# Patient Record
Sex: Male | Born: 1970
Health system: Southern US, Community
[De-identification: ages and names within clinical notes are randomized; demographics above are authoritative.]

## PROBLEM LIST (undated history)

## (undated) DIAGNOSIS — E785 Hyperlipidemia, unspecified: Secondary | ICD-10-CM

## (undated) DIAGNOSIS — I10 Essential (primary) hypertension: Secondary | ICD-10-CM

## (undated) DIAGNOSIS — E119 Type 2 diabetes mellitus without complications: Secondary | ICD-10-CM

## (undated) HISTORY — DX: Essential (primary) hypertension: I10

## (undated) HISTORY — DX: Hyperlipidemia, unspecified: E78.5

## (undated) HISTORY — PX: APPENDECTOMY: SHX54

## (undated) HISTORY — DX: Type 2 diabetes mellitus without complications: E11.9

---

## 2012-09-01 ENCOUNTER — Telehealth: Payer: Self-pay | Admitting: Nurse Practitioner

## 2012-09-01 NOTE — Telephone Encounter (Signed)
Will send chart back.

## 2012-09-01 NOTE — Telephone Encounter (Signed)
Pt notified samples up front

## 2012-09-01 NOTE — Telephone Encounter (Signed)
Ok to give samples

## 2012-09-29 ENCOUNTER — Telehealth: Payer: Self-pay | Admitting: Nurse Practitioner

## 2012-09-29 NOTE — Telephone Encounter (Signed)
OK form janumet samples

## 2012-09-29 NOTE — Telephone Encounter (Signed)
Please advise 

## 2012-09-29 NOTE — Telephone Encounter (Signed)
Pt aware and samples left up front 

## 2012-11-06 ENCOUNTER — Telehealth: Payer: Self-pay | Admitting: Nurse Practitioner

## 2012-11-06 NOTE — Telephone Encounter (Signed)
Samples up front patient aware  

## 2012-12-29 ENCOUNTER — Telehealth: Payer: Self-pay | Admitting: Nurse Practitioner

## 2012-12-29 NOTE — Telephone Encounter (Signed)
Samples up front for patient to pick up and a detailed message was left

## 2013-02-01 ENCOUNTER — Telehealth: Payer: Self-pay | Admitting: Nurse Practitioner

## 2013-02-01 NOTE — Telephone Encounter (Signed)
Ok for [employer] samples 50/500- no ketoconazole-NTBS

## 2013-02-02 NOTE — Telephone Encounter (Signed)
Patient aware.

## 2013-03-08 ENCOUNTER — Telehealth: Payer: Self-pay | Admitting: Nurse Practitioner

## 2013-03-08 NOTE — Telephone Encounter (Signed)
Up front 

## 2013-04-05 ENCOUNTER — Telehealth: Payer: Self-pay | Admitting: Nurse Practitioner

## 2013-04-16 NOTE — Telephone Encounter (Signed)
Aware. 

## 2013-05-09 ENCOUNTER — Telehealth: Payer: Self-pay | Admitting: Nurse Practitioner

## 2013-05-11 NOTE — Telephone Encounter (Signed)
Patient aware samples up front  

## 2013-06-21 ENCOUNTER — Telehealth: Payer: Self-pay | Admitting: Nurse Practitioner

## 2013-06-21 NOTE — Telephone Encounter (Signed)
Patient aware samples up front  

## 2013-07-12 ENCOUNTER — Telehealth: Payer: Self-pay | Admitting: Nurse Practitioner

## 2013-07-13 NOTE — Telephone Encounter (Signed)
Detailed message left that samples are up front to pick up

## 2013-08-13 ENCOUNTER — Telehealth: Payer: Self-pay | Admitting: Nurse Practitioner

## 2013-08-13 NOTE — Telephone Encounter (Signed)
Pt aware no samples available and to try back later

## 2013-08-20 ENCOUNTER — Telehealth: Payer: Self-pay | Admitting: Nurse Practitioner

## 2013-08-20 NOTE — Telephone Encounter (Signed)
Samples up front 

## 2013-09-25 ENCOUNTER — Telehealth: Payer: Self-pay | Admitting: Nurse Practitioner

## 2013-10-02 NOTE — Telephone Encounter (Signed)
No samples available 

## 2013-10-06 ENCOUNTER — Telehealth: Payer: Self-pay | Admitting: Nurse Practitioner

## 2013-10-08 ENCOUNTER — Telehealth: Payer: Self-pay | Admitting: Nurse Practitioner

## 2013-10-08 NOTE — Telephone Encounter (Signed)
Samples up front for patient to pick up.  

## 2013-10-08 NOTE — Telephone Encounter (Signed)
Patient aware mmm has no appts available today

## 2013-10-16 ENCOUNTER — Telehealth: Payer: Self-pay | Admitting: Nurse Practitioner

## 2013-10-16 NOTE — Telephone Encounter (Signed)
He just had a question that i answered.

## 2013-10-19 ENCOUNTER — Telehealth: Payer: Self-pay | Admitting: Nurse Practitioner

## 2013-10-19 NOTE — Telephone Encounter (Signed)
appt made

## 2013-10-22 ENCOUNTER — Encounter: Payer: Self-pay | Admitting: Nurse Practitioner

## 2013-10-22 ENCOUNTER — Encounter (INDEPENDENT_AMBULATORY_CARE_PROVIDER_SITE_OTHER): Payer: Self-pay

## 2013-10-22 ENCOUNTER — Ambulatory Visit: Payer: Self-pay | Admitting: Nurse Practitioner

## 2013-10-22 VITALS — BP 135/83 | HR 80 | Temp 98.1°F | Ht 71.0 in | Wt 236.0 lb

## 2013-10-22 DIAGNOSIS — L259 Unspecified contact dermatitis, unspecified cause: Secondary | ICD-10-CM

## 2013-10-22 DIAGNOSIS — E119 Type 2 diabetes mellitus without complications: Secondary | ICD-10-CM | POA: Insufficient documentation

## 2013-10-22 LAB — GLUCOSE, POCT (MANUAL RESULT ENTRY): POC Glucose: 311 mg/dl — AB (ref 70–99)

## 2013-10-22 LAB — POCT GLYCOSYLATED HEMOGLOBIN (HGB A1C): Hemoglobin A1C: 10.2

## 2013-10-22 MED ORDER — METHYLPREDNISOLONE ACETATE 80 MG/ML IJ SUSP
80.0000 mg | Freq: Once | INTRAMUSCULAR | Status: AC
  Administered 2013-10-22: 80 mg via INTRAMUSCULAR

## 2013-10-22 MED ORDER — METFORMIN HCL 1000 MG PO TABS
1000.0000 mg | ORAL_TABLET | Freq: Two times a day (BID) | ORAL | Status: DC
Start: 2013-10-22 — End: 2014-03-28

## 2013-10-22 MED ORDER — KETOCONAZOLE 200 MG PO TABS: 200.0000 mg | ORAL_TABLET | Freq: Every day | ORAL | Status: DC

## 2013-10-22 MED ORDER — GLIMEPIRIDE 4 MG PO TABS: 4.0000 mg | ORAL_TABLET | Freq: Every day | ORAL | Status: DC

## 2013-10-22 NOTE — Addendum Note (Signed)
Addended by: Bennie PieriniMARTIN, [name] on: 10/22/2013 08:56 AM   Modules accepted: Level of Service

## 2013-10-22 NOTE — Addendum Note (Signed)
Addended by: Bennie PieriniMARTIN, [name] on: 10/22/2013 09:02 AM   Modules accepted: Orders

## 2013-10-22 NOTE — Progress Notes (Signed)
Subjective:    Patient ID: Steven Mcgee, male    DOB: 15-May-1971, 43 y.o.   MRN  Patient here today for follow up of chronic medical problems. He has not been seen in over a year.   Diabetes He presents for his follow-up diabetic visit. He has type 2 diabetes mellitus. No MedicAlert identification noted. The initial diagnosis of diabetes was made 4 years ago. His disease course has been stable. There are no diabetic associated symptoms. Symptoms are stable. Current diabetic treatment includes oral agent (dual therapy) (patient has only been taking janumet once a day instead of 2 times a day.). He is compliant with treatment some of the time. His weight is stable. When asked about meal planning, he reported none. He has not had a previous visit with a dietician. He rarely participates in exercise. (Patient does not check blood sugars at home at all) An ACE inhibitor/angiotensin II receptor blocker is not being taken. He does not see a podiatrist.Eye exam is not current.      Review of Systems  Constitutional: Negative.   HENT: Negative.   Respiratory: Negative.   Cardiovascular: Negative.   Genitourinary: Negative.   Hematological: Negative.   Psychiatric/Behavioral: Negative.   All other systems reviewed and are negative.      Objective:   Physical Exam  Constitutional: He is oriented to person, place, and time. He appears well-developed and well-nourished.  Eyes: Pupils are equal, round, and reactive to light.  Neck: Normal range of motion. Neck supple.  Pulmonary/Chest: Effort normal and breath sounds normal.  Abdominal: Soft. Bowel sounds are normal.  Neurological: He is alert and oriented to person, place, and time.  Skin: Skin is warm and dry. There is erythema.  erythenmatous maculopapular lesions in linear pattern on forearms and abdomen.  Psychiatric: He has a normal mood and affect. His behavior is normal. Judgment and thought content normal.   BP 135/83  Pulse  80  Temp(Src) 98.1 F (36.7 C) (Oral)  Ht _0  (1.803 m)  Wt 236 lb (107.049 kg)  BMI 32.93 kg/m2  Results for orders placed in visit on 10/22/13  POCT GLYCOSYLATED HEMOGLOBIN (HGB A1C)      Result Value Ref Range   Hemoglobin A1C 10.2    GLUCOSE, POCT (MANUAL RESULT ENTRY)      Result Value Ref Range   POC Glucose 311 (*) 70 - 99 mg/dl         Assessment & Plan:   1. Diabetes    Orders Placed This Encounter  Procedures  . CMP14+EGFR  . POCT glycosylated hemoglobin (Hb A1C)  . POCT glucose (manual entry)   Meds ordered this encounter  Medications  . metFORMIN (GLUCOPHAGE) 1000 MG tablet    Sig: Take 1 tablet (1,000 mg total) by mouth 2 (two) times daily with a meal.    Dispense:  60 tablet    Refill:  3    Order Specific Question:  Supervising Provider    Answer:  Chipper Herb [[id number]]  . glimepiride (AMARYL) 4 MG tablet    Sig: Take 1 tablet (4 mg total) by mouth daily before breakfast.    Dispense:  30 tablet    Refill:  3    Order Specific Question:  Supervising Provider    Answer:  Chipper Herb [[id number]]  . ketoconazole (NIZORAL) 200 MG tablet    Sig: Take 1 tablet (200 mg total) by mouth daily.    Dispense:  30 tablet  Refill:  3    Order Specific Question:  Supervising Provider    Answer:  Chipper Herb [[id number]]   Need to keep diary of blood sugars Labs pending Health maintenance reviewed Diet and exercise encouraged Continue all meds Follow up  In 1 month   [place], FNP

## 2013-10-22 NOTE — Patient Instructions (Signed)

## 2013-10-24 LAB — CMP14+EGFR
A/G RATIO: 1.9 (ref 1.1–2.5)
ALK PHOS: 75 IU/L (ref 39–117)
ALT: 80 IU/L — AB (ref 0–44)
AST: 50 IU/L — AB (ref 0–40)
Albumin: 4.8 g/dL (ref 3.5–5.5)
BILIRUBIN TOTAL: 0.6 mg/dL (ref 0.0–1.2)
BUN / CREAT RATIO: 14 (ref 9–20)
BUN: 14 mg/dL (ref 6–24)
CO2: 22 mmol/L (ref 18–29)
CREATININE: 0.98 mg/dL (ref 0.76–1.27)
Calcium: 10.1 mg/dL (ref 8.7–10.2)
Chloride: 100 mmol/L (ref 97–108)
GFR, EST AFRICAN AMERICAN: 107 mL/min/{1.73_m2} (ref >59)
GFR, EST NON AFRICAN AMERICAN: 93 mL/min/{1.73_m2} (ref >59)
GLOBULIN, TOTAL: 2.5 g/dL (ref 1.5–4.5)
Glucose: 300 mg/dL — ABNORMAL HIGH (ref 65–99)
POTASSIUM: 4.7 mmol/L (ref 3.5–5.2)
SODIUM: 141 mmol/L (ref 134–144)
Total Protein: 7.3 g/dL (ref 6.0–8.5)

## 2013-11-14 ENCOUNTER — Telehealth: Payer: Self-pay | Admitting: Nurse Practitioner

## 2013-11-14 NOTE — Telephone Encounter (Signed)
appt scheduled

## 2013-12-03 ENCOUNTER — Encounter: Payer: Self-pay | Admitting: Nurse Practitioner

## 2013-12-03 ENCOUNTER — Ambulatory Visit (INDEPENDENT_AMBULATORY_CARE_PROVIDER_SITE_OTHER): Payer: Self-pay | Admitting: Nurse Practitioner

## 2013-12-03 VITALS — BP 146/100 | HR 82 | Temp 98.2°F | Ht 71.0 in | Wt 236.0 lb

## 2013-12-03 DIAGNOSIS — E1165 Type 2 diabetes mellitus with hyperglycemia: Principal | ICD-10-CM

## 2013-12-03 DIAGNOSIS — IMO0001 Reserved for inherently not codable concepts without codable children: Secondary | ICD-10-CM

## 2013-12-03 LAB — POCT UA - MICROALBUMIN: MICROALBUMIN (UR) POC: 50 mg/L

## 2013-12-03 LAB — POCT GLYCOSYLATED HEMOGLOBIN (HGB A1C): Hemoglobin A1C: 9.7

## 2013-12-03 NOTE — Patient Instructions (Signed)

## 2013-12-03 NOTE — Addendum Note (Signed)
Addended by: Bennie PieriniMARTIN, [name] on: 12/03/2013 08:28 AM   Modules accepted: Orders, Level of Service

## 2013-12-03 NOTE — Progress Notes (Signed)
   Subjective:    Patient ID: Steven Mcgee, male    DOB: 07/14/1970, 43 y.o.   MRN  Patient here today for follow up of chronic medical problems. He is usually not compliant about coming in for follow ups but here back in May and his hgba1c was 10.2. We added amaryl along with metformin.   Diabetes He presents for his follow-up diabetic visit. He has type 2 diabetes mellitus. No MedicAlert identification noted. The initial diagnosis of diabetes was made 4 years ago. His disease course has been stable. There are no diabetic associated symptoms. Symptoms are stable. Current diabetic treatment includes oral agent (dual therapy) (patient has only been taking janumet once a day instead of 2 times a day.). He is compliant with treatment some of the time. His weight is stable. When asked about meal planning, he reported none. He has not had a previous visit with a dietician. He rarely participates in exercise. His breakfast blood glucose is taken between 9-10 am. His breakfast blood glucose range is generally 130-140 mg/dl. (Patient does not check blood sugars at home very often and since he did not check them at all before last visit he does not know if they are better or not.) An ACE inhibitor/angiotensin II receptor blocker is not being taken. He does not see a podiatrist.Eye exam is not current.      Review of Systems  Constitutional: Negative.   HENT: Negative.   Respiratory: Negative.   Cardiovascular: Negative.   Genitourinary: Negative.   Hematological: Negative.   Psychiatric/Behavioral: Negative.   All other systems reviewed and are negative.      Objective:   Physical Exam  Constitutional: He is oriented to person, place, and time. He appears well-developed and well-nourished.  Eyes: Pupils are equal, round, and reactive to light.  Neck: Normal range of motion. Neck supple.  Pulmonary/Chest: Effort normal and breath sounds normal.  Abdominal: Soft. Bowel sounds are normal.    Neurological: He is alert and oriented to person, place, and time.  Skin: Skin is warm and dry. There is erythema.  erythenmatous maculopapular lesions in linear pattern on forearms and abdomen.  Psychiatric: He has a normal mood and affect. His behavior is normal. Judgment and thought content normal.   BP 146/100  Pulse 82  Temp(Src) 98.2 F (36.8 C) (Oral)  Ht _0  (1.803 m)  Wt 236 lb (107.049 kg)  BMI 32.93 kg/m2  Results for orders placed in visit on 12/03/13  POCT UA - MICROALBUMIN      Result Value Ref Range   Microalbumin Ur, POC 50             Assessment & Plan:   1. Type II or unspecified type diabetes mellitus without mention of complication, uncontrolled    Orders Placed This Encounter  Procedures  . CMP14+EGFR  . NMR, lipoprofile  . Microalbumin, urine  . POCT glycosylated hemoglobin (Hb A1C)  . POCT UA - Microalbumin   Continue to count carbs- hgba1c is trending downward Labs pending Health maintenance reviewed Diet and exercise encouraged Continue all meds Follow up  In 3 months   [place], FNP

## 2013-12-04 LAB — CMP14+EGFR
ALT: 102 IU/L — AB (ref 0–44)
AST: 64 IU/L — ABNORMAL HIGH (ref 0–40)
Albumin/Globulin Ratio: 2.1 (ref 1.1–2.5)
Albumin: 5 g/dL (ref 3.5–5.5)
Alkaline Phosphatase: 70 IU/L (ref 39–117)
BILIRUBIN TOTAL: 0.7 mg/dL (ref 0.0–1.2)
BUN/Creatinine Ratio: 15 (ref 9–20)
BUN: 14 mg/dL (ref 6–24)
CALCIUM: 9.8 mg/dL (ref 8.7–10.2)
CHLORIDE: 98 mmol/L (ref 97–108)
CO2: 21 mmol/L (ref 18–29)
Creatinine, Ser: 0.92 mg/dL (ref 0.76–1.27)
GFR calc Af Amer: 116 mL/min/{1.73_m2} (ref >59)
GFR calc non Af Amer: 100 mL/min/{1.73_m2} (ref >59)
GLUCOSE: 238 mg/dL — AB (ref 65–99)
Globulin, Total: 2.4 g/dL (ref 1.5–4.5)
Potassium: 4.3 mmol/L (ref 3.5–5.2)
Sodium: 140 mmol/L (ref 134–144)
TOTAL PROTEIN: 7.4 g/dL (ref 6.0–8.5)

## 2013-12-04 LAB — NMR, LIPOPROFILE
Cholesterol: 234 mg/dL — ABNORMAL HIGH (ref 100–199)
HDL Cholesterol by NMR: 46 mg/dL (ref >39)
HDL PARTICLE NUMBER: 39.9 umol/L (ref >=30.5)
LDL Particle Number: 1994 nmol/L — ABNORMAL HIGH (ref <1000)
LDL Size: 19.7 nm (ref >20.5)
LDLC SERPL CALC-MCNC: 112 mg/dL — ABNORMAL HIGH (ref 0–99)
LP-IR SCORE: 90 — AB (ref <=45)
SMALL LDL PARTICLE NUMBER: 1545 nmol/L — AB (ref <=527)
Triglycerides by NMR: 379 mg/dL — ABNORMAL HIGH (ref 0–149)

## 2013-12-04 LAB — MICROALBUMIN, URINE: MICROALBUM., U, RANDOM: 509.5 ug/mL — AB (ref 0.0–17.0)

## 2013-12-06 ENCOUNTER — Telehealth: Payer: Self-pay | Admitting: Family Medicine

## 2013-12-06 NOTE — Telephone Encounter (Signed)
Message copied by Azalee CourseFULP, Helem Reesor on Thu Dec 06, 2013  9:55 AM ------      Message from: Bennie PieriniMARTIN, [name]      Created: Wed Dec 05, 2013 12:38 PM       Hgba1c discussed at appointment- medication changes made      LDL particle numbers are terrible- needs to get back on statins      Kidney and liver function stable      Continue current meds- low fat diet and exercise and recheck in 3 months             ------

## 2014-03-28 ENCOUNTER — Other Ambulatory Visit: Payer: Self-pay | Admitting: Nurse Practitioner

## 2014-03-29 NOTE — Telephone Encounter (Signed)
Last seen 12/03/13 MMM Last glucose 12/03/13

## 2014-03-30 ENCOUNTER — Other Ambulatory Visit: Payer: Self-pay | Admitting: Nurse Practitioner

## 2014-04-01 NOTE — Telephone Encounter (Signed)
Last seen and last glucose 12/03/13  MMM

## 2014-05-28 ENCOUNTER — Other Ambulatory Visit: Payer: Self-pay | Admitting: Nurse Practitioner

## 2014-07-01 ENCOUNTER — Other Ambulatory Visit: Payer: Self-pay | Admitting: Nurse Practitioner

## 2014-07-08 ENCOUNTER — Other Ambulatory Visit: Payer: Self-pay | Admitting: Nurse Practitioner

## 2014-08-05 ENCOUNTER — Other Ambulatory Visit: Payer: Self-pay | Admitting: Nurse Practitioner

## 2014-08-05 NOTE — Telephone Encounter (Signed)
Last seen 12/03/13

## 2014-08-06 NOTE — Telephone Encounter (Signed)
no more refills without being seen  

## 2014-08-10 ENCOUNTER — Other Ambulatory Visit: Payer: Self-pay | Admitting: Nurse Practitioner

## 2014-09-14 ENCOUNTER — Other Ambulatory Visit: Payer: Self-pay | Admitting: Nurse Practitioner

## 2014-09-16 NOTE — Telephone Encounter (Signed)
Last seen and last glucose 12/03/13  MMM 

## 2014-09-17 NOTE — Telephone Encounter (Signed)
no more refills without being seen  

## 2014-10-18 ENCOUNTER — Telehealth: Payer: Self-pay | Admitting: Nurse Practitioner

## 2014-10-18 ENCOUNTER — Other Ambulatory Visit: Payer: Self-pay | Admitting: Nurse Practitioner

## 2014-10-18 MED ORDER — METFORMIN HCL 1000 MG PO TABS: 1000.0000 mg | ORAL_TABLET | Freq: Two times a day (BID) | ORAL | Status: DC

## 2014-10-18 MED ORDER — GLIMEPIRIDE 4 MG PO TABS: ORAL_TABLET | ORAL | Status: DC

## 2014-10-18 NOTE — Telephone Encounter (Signed)
Patient states he is suppose to have health insurance in July and wants to know if you will fill rx until then

## 2014-10-18 NOTE — Telephone Encounter (Signed)
Patient aware.

## 2014-10-18 NOTE — Telephone Encounter (Signed)
Refills done- but ntbs when gets insurance

## 2015-01-08 ENCOUNTER — Other Ambulatory Visit: Payer: Self-pay | Admitting: Nurse Practitioner

## 2015-01-16 ENCOUNTER — Telehealth: Payer: Self-pay | Admitting: Nurse Practitioner

## 2015-01-16 NOTE — Telephone Encounter (Signed)
Left detailed message on patients voicemail that her MMM we are not able to work him in today and to call office back to set up an appt

## 2015-01-21 ENCOUNTER — Other Ambulatory Visit: Payer: Self-pay | Admitting: Nurse Practitioner

## 2015-01-21 MED ORDER — GLIMEPIRIDE 4 MG PO TABS: ORAL_TABLET | ORAL | Status: DC

## 2015-01-21 MED ORDER — METFORMIN HCL 1000 MG PO TABS: 1000.0000 mg | ORAL_TABLET | Freq: Two times a day (BID) | ORAL | Status: DC

## 2015-01-21 NOTE — Telephone Encounter (Signed)
I sent the Rx to the pharmacy. Must make appointment

## 2015-01-22 NOTE — Telephone Encounter (Signed)
Called and informed patient that Rx was sent to pharmacy.  Also informed patient he needs to make an appointment to see doctor.  Patient stated he will call back to make an appointment.

## 2015-03-20 ENCOUNTER — Other Ambulatory Visit: Payer: Self-pay | Admitting: Nurse Practitioner

## 2015-03-20 NOTE — Telephone Encounter (Signed)
Not seen since 11/2013

## 2015-03-20 NOTE — Telephone Encounter (Signed)
Pt aware refills have been sent in & appt has been made

## 2015-03-20 NOTE — Telephone Encounter (Signed)
no more refills without being seen  

## 2015-03-31 ENCOUNTER — Encounter: Payer: Self-pay | Admitting: Nurse Practitioner

## 2015-03-31 ENCOUNTER — Ambulatory Visit: Payer: Self-pay | Admitting: Nurse Practitioner

## 2015-03-31 VITALS — BP 136/87 | HR 78 | Temp 97.8°F | Ht 71.0 in | Wt 236.0 lb

## 2015-03-31 DIAGNOSIS — E119 Type 2 diabetes mellitus without complications: Secondary | ICD-10-CM

## 2015-03-31 DIAGNOSIS — Z6832 Body mass index (BMI) 32.0-32.9, adult: Secondary | ICD-10-CM

## 2015-03-31 DIAGNOSIS — E0801 Diabetes mellitus due to underlying condition with hyperosmolarity with coma: Secondary | ICD-10-CM

## 2015-03-31 LAB — POCT UA - MICROALBUMIN: Microalbumin Ur, POC: 100 mg/L

## 2015-03-31 LAB — POCT GLYCOSYLATED HEMOGLOBIN (HGB A1C): Hemoglobin A1C: 10.8

## 2015-03-31 MED ORDER — GLIMEPIRIDE 4 MG PO TABS: 8.0000 mg | ORAL_TABLET | Freq: Every day | ORAL | Status: DC

## 2015-03-31 MED ORDER — METFORMIN HCL 1000 MG PO TABS
1000.0000 mg | ORAL_TABLET | Freq: Two times a day (BID) | ORAL | Status: DC
Start: 2015-03-31 — End: 2015-09-23

## 2015-03-31 NOTE — Progress Notes (Signed)
Subjective:    Patient ID: Steven Mcgee, male    DOB: [birth year], 44 y.o.   MRN  Patient here today for follow up of chronic medical problems. He is usually not compliant about coming in for follow ups but here back in May and his hgba1c was 9.7. We added amaryl along with metformin.   Diabetes He presents for his follow-up diabetic visit. He has type 2 diabetes mellitus. His disease course has been fluctuating. There are no hypoglycemic associated symptoms. Pertinent negatives for diabetes include no foot paresthesias, no polydipsia, no polyphagia, no polyuria and no weakness. There are no hypoglycemic complications. Symptoms are stable. There are no diabetic complications. Risk factors for coronary artery disease include dyslipidemia, hypertension, obesity and post-menopausal. Current diabetic treatment includes oral agent (dual therapy). He is compliant with treatment some of the time. When asked about meal planning, he reported none. He has not had a previous visit with a dietitian. He rarely participates in exercise. Home blood sugar record trend: does not check blood sugars very often. His breakfast blood glucose range is generally 130-140 mg/dl. An ACE inhibitor/angiotensin II receptor blocker is not being taken. He does not see a podiatrist.Eye exam is not current.      Review of Systems  Constitutional: Negative.   HENT: Negative.   Respiratory: Negative.   Cardiovascular: Negative.   Endocrine: Negative for polydipsia, polyphagia and polyuria.  Genitourinary: Negative.   Neurological: Negative for weakness.  Hematological: Negative.   Psychiatric/Behavioral: Negative.   All other systems reviewed and are negative.      Objective:   Physical Exam  Constitutional: He is oriented to person, place, and time. He appears well-developed and well-nourished.  Eyes: Pupils are equal, round, and reactive to light.  Neck: Normal range of motion. Neck supple.  Pulmonary/Chest:  Effort normal and breath sounds normal.  Abdominal: Soft. Bowel sounds are normal.  Neurological: He is alert and oriented to person, place, and time.  Skin: Skin is warm and dry. There is erythema.  erythenmatous maculopapular lesions in linear pattern on forearms and abdomen.  Psychiatric: He has a normal mood and affect. His behavior is normal. Judgment and thought content normal.   BP 136/87 mmHg  Pulse 78  Temp(Src) 97.8 F (36.6 C) (Oral)  Ht _0  (1.803 m)  Wt 236 lb (107.049 kg)  BMI 32.93 kg/m2  Results for orders placed or performed in visit on 03/31/15  POCT glycosylated hemoglobin (Hb A1C)  Result Value Ref Range   Hemoglobin A1C 10.8   POCT UA - Microalbumin  Result Value Ref Range   Microalbumin Ur, POC 100 mg/L        Assessment & Plan:   1. 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  Dispense: 60 tablet; Refill: 3 - glimepiride (AMARYL) 4 MG tablet; Take 2 tablets (8 mg total) by mouth daily with breakfast.  Dispense: 60 tablet; Refill: 3  2. BMI 32.0-32.9,adult Discussed diet and exercise for person with BMI >25 Will recheck weight in 3-6 months    Patient refuses adult immunizations due to no insurance Labs  pending Health maintenance reviewed Diet and exercise encouraged Continue all meds Follow up  In 3 months   [place], FNP

## 2015-03-31 NOTE — Patient Instructions (Signed)

## 2015-04-01 LAB — CMP14+EGFR
ALBUMIN: 4.7 g/dL (ref 3.5–5.5)
ALT: 132 IU/L — ABNORMAL HIGH (ref 0–44)
AST: 89 IU/L — ABNORMAL HIGH (ref 0–40)
Albumin/Globulin Ratio: 1.7 (ref 1.1–2.5)
Alkaline Phosphatase: 74 IU/L (ref 39–117)
BUN / CREAT RATIO: 13 (ref 9–20)
BUN: 12 mg/dL (ref 6–24)
Bilirubin Total: 1.1 mg/dL (ref 0.0–1.2)
CALCIUM: 9.5 mg/dL (ref 8.7–10.2)
CO2: 19 mmol/L (ref 18–29)
Chloride: 97 mmol/L (ref 97–106)
Creatinine, Ser: 0.89 mg/dL (ref 0.76–1.27)
GFR calc Af Amer: 119 mL/min/{1.73_m2} (ref >59)
GFR calc non Af Amer: 103 mL/min/{1.73_m2} (ref >59)
Globulin, Total: 2.7 g/dL (ref 1.5–4.5)
Glucose: 256 mg/dL — ABNORMAL HIGH (ref 65–99)
Potassium: 4.3 mmol/L (ref 3.5–5.2)
SODIUM: 139 mmol/L (ref 136–144)
Total Protein: 7.4 g/dL (ref 6.0–8.5)

## 2015-04-01 LAB — LIPID PANEL
Chol/HDL Ratio: 5.7 ratio units — ABNORMAL HIGH (ref 0.0–5.0)
Cholesterol, Total: 224 mg/dL — ABNORMAL HIGH (ref 100–199)
HDL: 39 mg/dL — ABNORMAL LOW (ref >39)
LDL CALC: 116 mg/dL — AB (ref 0–99)
Triglycerides: 344 mg/dL — ABNORMAL HIGH (ref 0–149)
VLDL CHOLESTEROL CAL: 69 mg/dL — AB (ref 5–40)

## 2015-04-01 LAB — MICROALBUMIN, URINE: Microalbumin, Urine: 609.6 ug/mL

## 2015-04-03 ENCOUNTER — Other Ambulatory Visit: Payer: Self-pay | Admitting: Nurse Practitioner

## 2015-04-03 LAB — HEPATITIS PANEL, ACUTE
HEP B S AG: NEGATIVE
Hep A IgM: NEGATIVE
Hep B C IgM: NEGATIVE
Hep C Virus Ab: <0.1 s/co ratio (ref 0.0–0.9)

## 2015-04-03 LAB — SPECIMEN STATUS REPORT

## 2015-04-03 MED ORDER — FENOFIBRATE 145 MG PO TABS: 145.0000 mg | ORAL_TABLET | Freq: Every day | ORAL | Status: DC

## 2015-06-18 ENCOUNTER — Telehealth: Payer: Self-pay | Admitting: Nurse Practitioner

## 2015-06-19 NOTE — Telephone Encounter (Signed)
Pt given appt 1/16 at 11:30.

## 2015-06-23 ENCOUNTER — Encounter: Payer: Self-pay | Admitting: Nurse Practitioner

## 2015-06-23 ENCOUNTER — Ambulatory Visit (INDEPENDENT_AMBULATORY_CARE_PROVIDER_SITE_OTHER): Payer: Self-pay | Admitting: Nurse Practitioner

## 2015-06-23 DIAGNOSIS — Z024 Encounter for examination for driving license: Secondary | ICD-10-CM

## 2015-06-23 DIAGNOSIS — E119 Type 2 diabetes mellitus without complications: Secondary | ICD-10-CM

## 2015-06-23 LAB — POCT URINALYSIS DIPSTICK
BILIRUBIN UA: NEGATIVE
GLUCOSE UA: NEGATIVE
KETONES UA: NEGATIVE
Leukocytes, UA: NEGATIVE
NITRITE UA: NEGATIVE
PH UA: 5
RBC UA: NEGATIVE
Urobilinogen, UA: NEGATIVE

## 2015-06-23 LAB — POCT GLYCOSYLATED HEMOGLOBIN (HGB A1C): HEMOGLOBIN A1C: 9.2

## 2015-06-23 NOTE — Progress Notes (Signed)
Patient in today for private DOT physical.- see scanned in physical   Results for orders placed or performed in visit on 06/23/15  POCT glycosylated hemoglobin (Hb A1C)  Result Value Ref Range   Hemoglobin A1C 9.2

## 2015-08-25 ENCOUNTER — Other Ambulatory Visit: Payer: Self-pay | Admitting: Nurse Practitioner

## 2015-09-23 ENCOUNTER — Other Ambulatory Visit: Payer: Self-pay | Admitting: Nurse Practitioner

## 2015-10-23 ENCOUNTER — Other Ambulatory Visit: Payer: Self-pay | Admitting: Nurse Practitioner

## 2015-10-24 ENCOUNTER — Other Ambulatory Visit: Payer: Self-pay | Admitting: Nurse Practitioner

## 2015-10-24 MED ORDER — KETOCONAZOLE 200 MG PO TABS: 200.0000 mg | ORAL_TABLET | Freq: Every day | ORAL | Status: DC

## 2015-10-24 NOTE — Telephone Encounter (Signed)
Pt wants this rx for his skin condition, and says you have sent it in before for him. Is it ok to send it? Please advise.

## 2015-11-21 ENCOUNTER — Other Ambulatory Visit: Payer: Self-pay | Admitting: Nurse Practitioner

## 2015-12-18 ENCOUNTER — Other Ambulatory Visit: Payer: Self-pay | Admitting: Nurse Practitioner

## 2015-12-18 NOTE — Telephone Encounter (Signed)
Last refill without being seen 

## 2016-01-15 ENCOUNTER — Other Ambulatory Visit: Payer: Self-pay | Admitting: Nurse Practitioner

## 2016-01-15 NOTE — Telephone Encounter (Signed)
Medication refilled. Patient NTBS for follow up and lab work

## 2016-02-12 ENCOUNTER — Other Ambulatory Visit: Payer: Self-pay | Admitting: Family

## 2016-02-12 NOTE — Telephone Encounter (Signed)
Last refill without being seen 

## 2016-03-12 ENCOUNTER — Other Ambulatory Visit: Payer: Self-pay | Admitting: Nurse Practitioner

## 2016-03-12 NOTE — Telephone Encounter (Signed)
Pt has appt for 10/23

## 2016-03-12 NOTE — Telephone Encounter (Signed)
Last refill without being seen 

## 2016-03-25 ENCOUNTER — Encounter: Payer: Self-pay | Admitting: Nurse Practitioner

## 2016-03-25 ENCOUNTER — Ambulatory Visit (INDEPENDENT_AMBULATORY_CARE_PROVIDER_SITE_OTHER): Payer: Self-pay | Admitting: Nurse Practitioner

## 2016-03-25 VITALS — BP 144/100 | HR 73 | Temp 98.0°F | Ht 71.0 in | Wt 235.0 lb

## 2016-03-25 DIAGNOSIS — Z6832 Body mass index (BMI) 32.0-32.9, adult: Secondary | ICD-10-CM

## 2016-03-25 DIAGNOSIS — E119 Type 2 diabetes mellitus without complications: Secondary | ICD-10-CM

## 2016-03-25 DIAGNOSIS — Z13220 Encounter for screening for lipoid disorders: Secondary | ICD-10-CM

## 2016-03-25 LAB — BAYER DCA HB A1C WAIVED: HB A1C (BAYER DCA - WAIVED): 10 % — ABNORMAL HIGH (ref <7.0)

## 2016-03-25 MED ORDER — METFORMIN HCL 1000 MG PO TABS: 1000.0000 mg | ORAL_TABLET | Freq: Two times a day (BID) | ORAL | 2 refills | Status: DC

## 2016-03-25 MED ORDER — GLIMEPIRIDE 4 MG PO TABS: ORAL_TABLET | ORAL | 3 refills | Status: DC

## 2016-03-25 NOTE — Patient Instructions (Signed)

## 2016-03-25 NOTE — Progress Notes (Signed)
Subjective:    Patient ID: Steven Mcgee, male    DOB: [birth year], 45 y.o.   MRN  Patient here today for follow up of chronic medical problems. He is usually not compliant about coming in for follow ups. He does not check blood sugars. He eats whatever he wants to. Does not watch diet at all.   Diabetes  He presents for his follow-up diabetic visit. He has type 2 diabetes mellitus. His disease course has been fluctuating. There are no hypoglycemic associated symptoms. Pertinent negatives for diabetes include no foot paresthesias, no polydipsia, no polyphagia, no polyuria and no weakness. There are no hypoglycemic complications. Symptoms are stable. There are no diabetic complications. Risk factors for coronary artery disease include dyslipidemia, hypertension, obesity and post-menopausal. Current diabetic treatment includes oral agent (dual therapy). He is compliant with treatment some of the time. When asked about meal planning, he reported none. He has not had a previous visit with a dietitian. He rarely participates in exercise. Home blood sugar record trend: does not check blood sugars very often. His breakfast blood glucose range is generally 130-140 mg/dl. An ACE inhibitor/angiotensin II receptor blocker is not being taken. He does not see a podiatrist.Eye exam is not current.      Review of Systems  Constitutional: Negative.   HENT: Negative.   Respiratory: Negative.   Cardiovascular: Negative.   Endocrine: Negative for polydipsia, polyphagia and polyuria.  Genitourinary: Negative.   Neurological: Negative for weakness.  Hematological: Negative.   Psychiatric/Behavioral: Negative.   All other systems reviewed and are negative.      Objective:   Physical Exam  Constitutional: He is oriented to person, place, and time. He appears well-developed and well-nourished.  Eyes: Pupils are equal, round, and reactive to light.  Neck: Normal range of motion. Neck supple.    Pulmonary/Chest: Effort normal and breath sounds normal.  Abdominal: Soft. Bowel sounds are normal.  Neurological: He is alert and oriented to person, place, and time.  Skin: Skin is warm and dry. There is erythema.  erythenmatous maculopapular lesions in linear pattern on forearms and abdomen.  Psychiatric: He has a normal mood and affect. His behavior is normal. Judgment and thought content normal.    BP (!) 144/100 (BP Location: Left Arm, Cuff Size: Normal)   Pulse 73   Temp 98 F (36.7 C) (Oral)   Ht _0  (1.803 m)   Wt 235 lb (106.6 kg)   BMI 32.78 kg/m         Assessment & Plan:   1. Type 2 diabetes mellitus without complication, without long-term current use of insulin ([place]) Patient refuses any other meds due to no insurance' He says he does not eat junk or carbs- refuses to see nutritionist I explained affect of diabetes on kidney function and patient says he is not worried Told will not pass DOT physical with Hgba1c greater then 10 Will see him back in December to recheck hgba1c - Bayer DCA Hb A1c Waived - CMP14+EGFR - metFORMIN (GLUCOPHAGE) 1000 MG tablet; Take 1 tablet (1,000 mg total) by mouth 2 (two) times daily.  Dispense: 60 tablet; Refill: 2 - glimepiride (AMARYL) 4 MG tablet; TAKE 2 TABLETS BY MOUTH DAILY WITH BREAKFAST  Dispense: 60 tablet; Refill: 3  2. Screening for cholesterol level   3. BMI 32.0-32.9,adult Discussed diet and exercise for person with BMI >25 Will recheck weight in 3-6 months  4. hypertension Refuses blood pressure meds Says will come down on its  own- Do not add salt to diet  Labs pending Health maintenance reviewed Diet and exercise encouraged Continue all meds Follow up  In 2 months   [place], FNP

## 2016-03-26 LAB — CMP14+EGFR
ALBUMIN: 4.7 g/dL (ref 3.5–5.5)
ALK PHOS: 72 IU/L (ref 39–117)
ALT: 64 IU/L — AB (ref 0–44)
AST: 51 IU/L — ABNORMAL HIGH (ref 0–40)
Albumin/Globulin Ratio: 1.8 (ref 1.2–2.2)
BILIRUBIN TOTAL: 0.8 mg/dL (ref 0.0–1.2)
BUN / CREAT RATIO: 12 (ref 9–20)
BUN: 10 mg/dL (ref 6–24)
CHLORIDE: 95 mmol/L — AB (ref 96–106)
CO2: 25 mmol/L (ref 18–29)
CREATININE: 0.85 mg/dL (ref 0.76–1.27)
Calcium: 9.4 mg/dL (ref 8.7–10.2)
GFR calc Af Amer: 120 mL/min/{1.73_m2} (ref >59)
GFR calc non Af Amer: 104 mL/min/{1.73_m2} (ref >59)
GLUCOSE: 157 mg/dL — AB (ref 65–99)
Globulin, Total: 2.6 g/dL (ref 1.5–4.5)
Potassium: 4.2 mmol/L (ref 3.5–5.2)
Sodium: 137 mmol/L (ref 134–144)
Total Protein: 7.3 g/dL (ref 6.0–8.5)

## 2016-03-29 ENCOUNTER — Ambulatory Visit: Payer: Self-pay | Admitting: Nurse Practitioner

## 2016-04-01 ENCOUNTER — Other Ambulatory Visit: Payer: Self-pay

## 2016-04-01 DIAGNOSIS — E119 Type 2 diabetes mellitus without complications: Secondary | ICD-10-CM

## 2016-04-01 MED ORDER — GLIMEPIRIDE 4 MG PO TABS: ORAL_TABLET | ORAL | 0 refills | Status: DC

## 2016-04-09 ENCOUNTER — Other Ambulatory Visit: Payer: Self-pay | Admitting: Nurse Practitioner

## 2016-04-23 ENCOUNTER — Encounter: Payer: Self-pay | Admitting: *Deleted

## 2016-05-21 ENCOUNTER — Ambulatory Visit (INDEPENDENT_AMBULATORY_CARE_PROVIDER_SITE_OTHER): Payer: Self-pay | Admitting: Nurse Practitioner

## 2016-05-21 ENCOUNTER — Encounter: Payer: Self-pay | Admitting: Nurse Practitioner

## 2016-05-21 VITALS — BP 138/85 | HR 79 | Temp 97.4°F | Ht 71.0 in | Wt 230.0 lb

## 2016-05-21 DIAGNOSIS — E119 Type 2 diabetes mellitus without complications: Secondary | ICD-10-CM

## 2016-05-21 LAB — BAYER DCA HB A1C WAIVED: HB A1C (BAYER DCA - WAIVED): 8.6 % — ABNORMAL HIGH (ref <7.0)

## 2016-05-21 MED ORDER — METFORMIN HCL 1000 MG PO TABS: 1000.0000 mg | ORAL_TABLET | Freq: Two times a day (BID) | ORAL | 1 refills | Status: DC

## 2016-05-21 MED ORDER — GLIMEPIRIDE 4 MG PO TABS: ORAL_TABLET | ORAL | 1 refills | Status: DC

## 2016-05-21 NOTE — Progress Notes (Signed)
   Subjective:    Patient ID: Steven Mcgee, male    DOB: 11/16/1970, 45 y.o.   MRN: [id number]  HPI  Patient was seen on 03/25/16 for follow up of chronic medical problems. His Hgba1c that day was 10.0. He has never been compliant with diet or medication since he was diagnosed with diabetes. 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 He has been trying to decrease carbs in diet and has been drinking sugar free tea. He does not check blood sugars at home. He has been taking his medication BID as rx. He denies any problems.   Review of Systems  Constitutional: Negative for diaphoresis.  Eyes: Negative for pain.  Respiratory: Negative for shortness of breath.   Cardiovascular: Negative for chest pain, palpitations and leg swelling.  Gastrointestinal: Negative for abdominal pain.  Endocrine: Negative for polydipsia.  Skin: Negative for rash.  Neurological: Negative for dizziness, weakness and headaches.  Hematological: Does not bruise/bleed easily.       Objective:   Physical Exam  Constitutional: He is oriented to person, place, and time. He appears well-developed and well-nourished. No distress.  Cardiovascular: Normal rate, regular rhythm and normal heart sounds.   Pulmonary/Chest: Effort normal and breath sounds normal.  Abdominal: Soft. Bowel sounds are normal.  Neurological: He is alert and oriented to person, place, and time.  Skin: Skin is warm.  Psychiatric: He has a normal mood and affect. His behavior is normal. Judgment and thought content normal.    BP 138/85   Pulse 79   Temp 97.4 F (36.3 C) (Oral)   Ht 5\' 11"  (1.803 m)   Wt 230 lb (104.3 kg)   BMI 32.08 kg/m   hgba1c- 8.6% down from 10.0     Assessment & Plan:  1. Type 2 diabetes mellitus without complication, without long-term current use of insulin (HCC)  hgba1c is improving but still need a lttle stricter carb counting - Bayer DCA Hb A1c  Waived - metFORMIN (GLUCOPHAGE) 1000 MG tablet; Take 1 tablet (1,000 mg total) by mouth 2 (two) times daily.  Dispense: 60 tablet; Refill: 1 - glimepiride (AMARYL) 4 MG tablet; TAKE 2 TABLETS BY MOUTH DAILY WITH BREAKFAST  Dispense: 180 tablet; Refill: 1  RTO in 3 months follow up  [name] Daphine DeutscherMartin, FNP

## 2016-05-21 NOTE — Patient Instructions (Signed)
Carbohydrate Counting for Diabetes Mellitus, Adult Carbohydrate counting is a method for keeping track of how many carbohydrates you eat. Eating carbohydrates naturally increases the amount of sugar (glucose) in the blood. Counting how many carbohydrates you eat helps keep your blood glucose within normal limits, which helps you manage your diabetes (diabetes mellitus). It is important to know how many carbohydrates you can safely have in each meal. This is different for every person. A diet and nutrition specialist (registered dietitian) can help you make a meal plan and calculate how many carbohydrates you should have at each meal and snack. Carbohydrates are found in the following foods:  Grains, such as breads and cereals.  Dried beans and soy products.  Starchy vegetables, such as potatoes, peas, and corn.  Fruit and fruit juices.  Milk and yogurt.  Sweets and snack foods, such as cake, cookies, candy, chips, and soft drinks. How do I count carbohydrates? There are two ways to count carbohydrates in food. You can use either of the methods or a combination of both. Reading "Nutrition Facts" on packaged food  The "Nutrition Facts" list is included on the labels of almost all packaged foods and beverages in the [place] It includes:  The serving size.  Information about nutrients in each serving, including the grams (g) of carbohydrate per serving. To use the "Nutrition Facts":  Decide how many servings you will have.  Multiply the number of servings by the number of carbohydrates per serving.  The resulting number is the total amount of carbohydrates that you will be having. Learning standard serving sizes of other foods  When you eat foods containing carbohydrates that are not packaged or do not include "Nutrition Facts" on the label, you need to measure the servings in order to count the amount of carbohydrates:  Measure the foods that you will eat with a food scale or measuring  cup, if needed.  Decide how many standard-size servings you will eat.  Multiply the number of servings by 15. Most carbohydrate-rich foods have about 15 g of carbohydrates per serving.  For example, if you eat 8 oz (170 g) of strawberries, you will have eaten 2 servings and 30 g of carbohydrates (2 servings x 15 g = 30 g).  For foods that have more than one food mixed, such as soups and casseroles, you must count the carbohydrates in each food that is included. The following list contains standard serving sizes of common carbohydrate-rich foods. Each of these servings has about 15 g of carbohydrates:   hamburger bun or  English muffin.   oz (15 mL) syrup.   oz (14 g) jelly.  1 slice of bread.  1 six-inch tortilla.  3 oz (85 g) cooked rice or pasta.  4 oz (113 g) cooked dried beans.  4 oz (113 g) starchy vegetable, such as peas, corn, or potatoes.  4 oz (113 g) hot cereal.  4 oz (113 g) mashed potatoes or  of a large baked potato.  4 oz (113 g) canned or frozen fruit.  4 oz (120 mL) fruit juice.  4-6 crackers.  6 chicken nuggets.  6 oz (170 g) unsweetened dry cereal.  6 oz (170 g) plain fat-free yogurt or yogurt sweetened with artificial sweeteners.  8 oz (240 mL) milk.  8 oz (170 g) fresh fruit or one small piece of fruit.  24 oz (680 g) popped popcorn. Example of carbohydrate counting Sample meal  3 oz (85 g) chicken breast.  6 oz (  170 g) brown rice.  4 oz (113 g) corn.  8 oz (240 mL) milk.  8 oz (170 g) strawberries with sugar-free whipped topping. Carbohydrate calculation 1. Identify the foods that contain carbohydrates:  Rice.  Corn.  Milk.  Strawberries. 2. Calculate how many servings you have of each food:  2 servings rice.  1 serving corn.  1 serving milk.  1 serving strawberries. 3. Multiply each number of servings by 15 g:  2 servings rice x 15 g = 30 g.  1 serving corn x 15 g = 15 g.  1 serving milk x 15 g = 15  g.  1 serving strawberries x 15 g = 15 g. 4. Add together all of the amounts to find the total grams of carbohydrates eaten:  30 g + 15 g + 15 g + 15 g = 75 g of carbohydrates total. This information is not intended to replace advice given to you by your health care provider. Make sure you discuss any questions you have with your health care provider. Document Released: 05/24/2005 Document Revised: 12/12/2015 Document Reviewed: 11/05/2015 Elsevier Interactive Patient Education  2017 Elsevier Inc.  

## 2016-05-25 ENCOUNTER — Ambulatory Visit: Payer: Self-pay | Admitting: Nurse Practitioner

## 2016-05-27 ENCOUNTER — Ambulatory Visit: Payer: Self-pay | Admitting: Nurse Practitioner

## 2016-06-11 ENCOUNTER — Ambulatory Visit (INDEPENDENT_AMBULATORY_CARE_PROVIDER_SITE_OTHER): Payer: Self-pay | Admitting: Nurse Practitioner

## 2016-06-11 ENCOUNTER — Encounter: Payer: Self-pay | Admitting: Nurse Practitioner

## 2016-06-11 DIAGNOSIS — Z024 Encounter for examination for driving license: Secondary | ICD-10-CM

## 2016-06-11 LAB — URINALYSIS
Bilirubin, UA: NEGATIVE
Leukocytes, UA: NEGATIVE
Nitrite, UA: NEGATIVE
PH UA: 5 (ref 5.0–7.5)
PROTEIN UA: NEGATIVE
Specific Gravity, UA: 1.01 (ref 1.005–1.030)
Urobilinogen, Ur: 0.2 mg/dL (ref 0.2–1.0)

## 2016-06-11 NOTE — Progress Notes (Signed)
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

## 2016-07-14 ENCOUNTER — Other Ambulatory Visit: Payer: Self-pay | Admitting: Nurse Practitioner

## 2016-07-14 DIAGNOSIS — E119 Type 2 diabetes mellitus without complications: Secondary | ICD-10-CM

## 2016-09-08 ENCOUNTER — Other Ambulatory Visit: Payer: Self-pay | Admitting: Nurse Practitioner

## 2016-09-08 DIAGNOSIS — E119 Type 2 diabetes mellitus without complications: Secondary | ICD-10-CM

## 2016-10-07 ENCOUNTER — Other Ambulatory Visit: Payer: Self-pay | Admitting: Nurse Practitioner

## 2016-10-07 DIAGNOSIS — E119 Type 2 diabetes mellitus without complications: Secondary | ICD-10-CM

## 2016-10-28 ENCOUNTER — Other Ambulatory Visit: Payer: Self-pay | Admitting: Nurse Practitioner

## 2016-10-28 ENCOUNTER — Telehealth: Payer: Self-pay | Admitting: Nurse Practitioner

## 2016-10-28 MED ORDER — KETOCONAZOLE 200 MG PO TABS: 200.0000 mg | ORAL_TABLET | Freq: Every day | ORAL | 0 refills | Status: DC

## 2016-10-28 NOTE — Telephone Encounter (Signed)
Patient aware.

## 2016-10-28 NOTE — Telephone Encounter (Signed)
rx sent to pharmacy

## 2016-11-06 ENCOUNTER — Other Ambulatory Visit: Payer: Self-pay | Admitting: Nurse Practitioner

## 2016-11-06 DIAGNOSIS — E119 Type 2 diabetes mellitus without complications: Secondary | ICD-10-CM

## 2016-11-16 ENCOUNTER — Other Ambulatory Visit: Payer: Self-pay | Admitting: Nurse Practitioner

## 2016-11-16 DIAGNOSIS — E119 Type 2 diabetes mellitus without complications: Secondary | ICD-10-CM

## 2016-12-06 ENCOUNTER — Other Ambulatory Visit: Payer: Self-pay | Admitting: Nurse Practitioner

## 2016-12-06 DIAGNOSIS — E119 Type 2 diabetes mellitus without complications: Secondary | ICD-10-CM

## 2016-12-07 NOTE — Telephone Encounter (Signed)
Last seen 06/11/16

## 2016-12-07 NOTE — Telephone Encounter (Signed)
Last refill without being seen 

## 2017-01-11 ENCOUNTER — Other Ambulatory Visit: Payer: Self-pay | Admitting: Nurse Practitioner

## 2017-01-11 DIAGNOSIS — E119 Type 2 diabetes mellitus without complications: Secondary | ICD-10-CM

## 2017-01-28 ENCOUNTER — Encounter: Payer: Self-pay | Admitting: Nurse Practitioner

## 2017-01-28 ENCOUNTER — Ambulatory Visit (INDEPENDENT_AMBULATORY_CARE_PROVIDER_SITE_OTHER): Payer: BLUE CROSS/BLUE SHIELD | Admitting: Nurse Practitioner

## 2017-01-28 VITALS — BP 140/89 | HR 82 | Temp 98.5°F | Ht 71.0 in | Wt 232.0 lb

## 2017-01-28 DIAGNOSIS — Z13220 Encounter for screening for lipoid disorders: Secondary | ICD-10-CM | POA: Diagnosis not present

## 2017-01-28 DIAGNOSIS — E119 Type 2 diabetes mellitus without complications: Secondary | ICD-10-CM

## 2017-01-28 DIAGNOSIS — Z6832 Body mass index (BMI) 32.0-32.9, adult: Secondary | ICD-10-CM

## 2017-01-28 LAB — BAYER DCA HB A1C WAIVED: HB A1C (BAYER DCA - WAIVED): 9.1 % — ABNORMAL HIGH (ref <7.0)

## 2017-01-28 MED ORDER — SITAGLIPTIN PHOS-METFORMIN HCL 50-500 MG PO TABS: 1.0000 | ORAL_TABLET | Freq: Two times a day (BID) | ORAL | 5 refills | Status: DC

## 2017-01-28 NOTE — Patient Instructions (Signed)

## 2017-01-28 NOTE — Progress Notes (Signed)
Subjective:    Patient ID: Steven Mcgee, male    DOB: 1970/08/26, 46 y.o.   MRN: [id number]  HPI  Marilyn Wing is here today for follow up of chronic medical problem.  Outpatient Encounter Prescriptions as of 01/28/2017  Medication Sig  . glimepiride (AMARYL) 4 MG tablet TAKE 2 TABLETS BY MOUTH DAILY WITH BREAKFAST  . ketoconazole (NIZORAL) 200 MG tablet Take 1 tablet (200 mg total) by mouth daily.  . metFORMIN (GLUCOPHAGE) 1000 MG tablet TAKE 1 TABLET BY MOUTH TWICE A DAY   No facility-administered encounter medications on file as of 01/28/2017.     1. Type 2 diabetes mellitus without complication, without long-term current use of insulin ([place]) last HGBA1c was 8.6%. Patient is very noncomplinat with diet and exercise and meds for that matter  2. Screening for cholesterol level  Last ldl were elevated but patient has refused to have done the last 2 visit because he had no insurance. He has insurance now so we will check today.  3. BMI 32.0-32.9,adult  No recent weight changes    New complaints: none  Social history: Divorced and does not se his daughter at all.     Review of Systems  Constitutional: Negative for activity change and appetite change.  HENT: Negative.   Eyes: Negative for pain.  Respiratory: Negative for shortness of breath.   Cardiovascular: Negative for chest pain, palpitations and leg swelling.  Gastrointestinal: Negative for abdominal pain.  Endocrine: Negative for polydipsia.  Genitourinary: Negative.   Skin: Negative for rash.  Neurological: Negative for dizziness, weakness and headaches.  Hematological: Does not bruise/bleed easily.  Psychiatric/Behavioral: Negative.   All other systems reviewed and are negative.      Objective:   Physical Exam  Constitutional: He is oriented to person, place, and time. He appears well-developed and well-nourished.  HENT:  Head: Normocephalic.  Right Ear: External ear normal.  Left Ear: External  ear normal.  Nose: Nose normal.  Mouth/Throat: Oropharynx is clear and moist.  Eyes: Pupils are equal, round, and reactive to light. EOM are normal.  Neck: Normal range of motion. Neck supple. No JVD present. No thyromegaly present.  Cardiovascular: Normal rate, regular rhythm, normal heart sounds and intact distal pulses.  Exam reveals no gallop and no friction rub.   No murmur heard. Pulmonary/Chest: Effort normal and breath sounds normal. No respiratory distress. He has no wheezes. He has no rales. He exhibits no tenderness.  Abdominal: Soft. Bowel sounds are normal. He exhibits no mass. There is no tenderness.  Genitourinary: Prostate normal and penis normal.  Musculoskeletal: Normal range of motion. He exhibits no edema.  Lymphadenopathy:    He has no cervical adenopathy.  Neurological: He is alert and oriented to person, place, and time. No cranial nerve deficit.  Skin: Skin is warm and dry.  Psychiatric: He has a normal mood and affect. His behavior is normal. Judgment and thought content normal.   BP 140/89   Pulse 82   Temp 98.5 F (36.9 C) (Oral)   Ht 5' 11"  (1.803 m)   Wt 232 lb (105.2 kg)   BMI 32.36 kg/m   hgba1c 9.1%    Assessment & Plan:  1. Type 2 diabetes mellitus without complication, without long-term current use of insulin (HCC) Patient just laughs when talk about effect sof blood sugars on kidneys He refuses to check his blood sugars Changed metformin to janumet and coupon was guven - Bayer DCA Hb A1c Waived - CMP14+EGFR -  Microalbumin / creatinine urine ratio - sitaGLIPtin-metformin (JANUMET) 50-500 MG tablet; Take 1 tablet by mouth 2 (two) times daily with a meal.  Dispense: 60 tablet; Refill: 5  2. Screening for cholesterol level Labs pending - Lipid panel  3. BMI 32.0-32.9,adult Discussed diet and exercise for person with BMI >25 Will recheck weight in 3-6 months    Labs pending Health maintenance reviewed and encouraged- patient refeuses  all. Diet and exercise encouraged Continue all meds Follow up  In 3 months   [place], FNP

## 2017-01-29 LAB — CMP14+EGFR
A/G RATIO: 1.7 (ref 1.2–2.2)
ALT: 38 IU/L (ref 0–44)
AST: 25 IU/L (ref 0–40)
Albumin: 4.7 g/dL (ref 3.5–5.5)
Alkaline Phosphatase: 67 IU/L (ref 39–117)
BUN/Creatinine Ratio: 11 (ref 9–20)
BUN: 11 mg/dL (ref 6–24)
Bilirubin Total: 0.5 mg/dL (ref 0.0–1.2)
CALCIUM: 9.7 mg/dL (ref 8.7–10.2)
CO2: 19 mmol/L — AB (ref 20–29)
Chloride: 98 mmol/L (ref 96–106)
Creatinine, Ser: 0.98 mg/dL (ref 0.76–1.27)
GFR, EST AFRICAN AMERICAN: 105 mL/min/{1.73_m2} (ref >59)
GFR, EST NON AFRICAN AMERICAN: 91 mL/min/{1.73_m2} (ref >59)
GLUCOSE: 208 mg/dL — AB (ref 65–99)
Globulin, Total: 2.7 g/dL (ref 1.5–4.5)
POTASSIUM: 4.6 mmol/L (ref 3.5–5.2)
Sodium: 139 mmol/L (ref 134–144)
Total Protein: 7.4 g/dL (ref 6.0–8.5)

## 2017-01-29 LAB — LIPID PANEL
CHOL/HDL RATIO: 4.7 ratio (ref 0.0–5.0)
Cholesterol, Total: 183 mg/dL (ref 100–199)
HDL: 39 mg/dL — AB (ref >39)
TRIGLYCERIDES: 471 mg/dL — AB (ref 0–149)

## 2017-02-11 ENCOUNTER — Other Ambulatory Visit: Payer: Self-pay | Admitting: Nurse Practitioner

## 2017-02-11 DIAGNOSIS — E119 Type 2 diabetes mellitus without complications: Secondary | ICD-10-CM

## 2017-04-25 ENCOUNTER — Other Ambulatory Visit: Payer: Self-pay | Admitting: *Deleted

## 2017-04-25 DIAGNOSIS — E119 Type 2 diabetes mellitus without complications: Secondary | ICD-10-CM

## 2017-04-25 MED ORDER — GLIMEPIRIDE 4 MG PO TABS: ORAL_TABLET | ORAL | 1 refills | Status: DC

## 2017-05-13 ENCOUNTER — Encounter: Payer: Self-pay | Admitting: Nurse Practitioner

## 2017-05-27 ENCOUNTER — Other Ambulatory Visit: Payer: Self-pay

## 2017-05-27 ENCOUNTER — Other Ambulatory Visit: Payer: Self-pay | Admitting: Nurse Practitioner

## 2017-05-27 ENCOUNTER — Encounter: Payer: Self-pay | Admitting: Nurse Practitioner

## 2017-05-27 ENCOUNTER — Ambulatory Visit (INDEPENDENT_AMBULATORY_CARE_PROVIDER_SITE_OTHER): Payer: BLUE CROSS/BLUE SHIELD | Admitting: Nurse Practitioner

## 2017-05-27 DIAGNOSIS — Z024 Encounter for examination for driving license: Secondary | ICD-10-CM

## 2017-05-27 DIAGNOSIS — E119 Type 2 diabetes mellitus without complications: Secondary | ICD-10-CM

## 2017-05-27 LAB — URINALYSIS
BILIRUBIN UA: NEGATIVE
Ketones, UA: NEGATIVE
Leukocytes, UA: NEGATIVE
NITRITE UA: NEGATIVE
PH UA: 7 (ref 5.0–7.5)
PROTEIN UA: NEGATIVE
RBC UA: NEGATIVE
SPEC GRAV UA: 1.015 (ref 1.005–1.030)
UUROB: 0.2 mg/dL (ref 0.2–1.0)

## 2017-05-27 LAB — BAYER DCA HB A1C WAIVED: HB A1C (BAYER DCA - WAIVED): 10.7 % — ABNORMAL HIGH (ref <7.0)

## 2017-05-27 MED ORDER — SITAGLIPTIN PHOS-METFORMIN HCL 50-1000 MG PO TABS: 1.0000 | ORAL_TABLET | Freq: Two times a day (BID) | ORAL | 3 refills | Status: DC

## 2017-05-27 NOTE — Addendum Note (Signed)
Addended by: Bennie PieriniMARTIN, [name] on: 05/27/2017 03:12 PM   Modules accepted: Orders

## 2017-05-27 NOTE — Progress Notes (Addendum)
Patient ID: Steven SinnerLeslie Keith Oliveira, male   DOB: 07/04/1970, 46 y.o.   MRN: [id number]  Private DOT- see scanned in assessment PATIENT DID NOT HAVE DONE BECAUSE EHGBA1C WAS 10.7 WHICH IS TO HIGH TO PASS dot PHYSICAL.

## 2017-05-27 NOTE — Addendum Note (Signed)
Addended by: Bennie PieriniMARTIN, [name] on: 05/27/2017 03:10 PM   Modules accepted: Level of Service

## 2017-05-27 NOTE — Addendum Note (Signed)
Addended by: Bennie PieriniMARTIN, [name] on: 05/27/2017 03:32 PM   Modules accepted: Orders

## 2017-06-09 ENCOUNTER — Ambulatory Visit: Payer: BLUE CROSS/BLUE SHIELD | Admitting: Nurse Practitioner

## 2017-06-27 ENCOUNTER — Telehealth: Payer: Self-pay | Admitting: Nurse Practitioner

## 2017-06-27 NOTE — Telephone Encounter (Signed)
Patient needs to make an appointmnet- has  Not been seen since august 2018- needs labs drawn

## 2017-06-27 NOTE — Telephone Encounter (Signed)
I spoke with pt - coupon card up front...   He wants to know if you would rx lisinopril again?? He said you gave it to him a long time ago -- he has recently been checking BP and always runs in the 140"s / 90's.  Please send to the [facility] in [place] if approved

## 2017-06-27 NOTE — Telephone Encounter (Signed)
Pt called - will call back and schedule a appt

## 2017-10-13 ENCOUNTER — Other Ambulatory Visit: Payer: Self-pay | Admitting: Nurse Practitioner

## 2017-10-14 NOTE — Telephone Encounter (Signed)
Last refill without being seen 

## 2017-10-28 ENCOUNTER — Ambulatory Visit: Payer: BLUE CROSS/BLUE SHIELD | Admitting: Nurse Practitioner

## 2017-10-28 ENCOUNTER — Encounter: Payer: Self-pay | Admitting: Nurse Practitioner

## 2017-10-28 VITALS — BP 138/78 | HR 86 | Temp 98.0°F | Ht 71.0 in | Wt 233.0 lb

## 2017-10-28 DIAGNOSIS — Z6832 Body mass index (BMI) 32.0-32.9, adult: Secondary | ICD-10-CM | POA: Diagnosis not present

## 2017-10-28 DIAGNOSIS — Z13220 Encounter for screening for lipoid disorders: Secondary | ICD-10-CM | POA: Diagnosis not present

## 2017-10-28 DIAGNOSIS — E119 Type 2 diabetes mellitus without complications: Secondary | ICD-10-CM

## 2017-10-28 LAB — BAYER DCA HB A1C WAIVED: HB A1C (BAYER DCA - WAIVED): 7.6 % — ABNORMAL HIGH (ref <7.0)

## 2017-10-28 MED ORDER — KETOCONAZOLE 200 MG PO TABS: 200.0000 mg | ORAL_TABLET | Freq: Every day | ORAL | 0 refills | Status: DC

## 2017-10-28 MED ORDER — GLIMEPIRIDE 4 MG PO TABS
ORAL_TABLET | ORAL | 1 refills | Status: DC
Start: 2017-10-28 — End: 2018-01-30

## 2017-10-28 MED ORDER — SITAGLIPTIN PHOS-METFORMIN HCL 50-1000 MG PO TABS: 1.0000 | ORAL_TABLET | Freq: Two times a day (BID) | ORAL | 1 refills | Status: DC

## 2017-10-28 NOTE — Patient Instructions (Signed)

## 2017-10-28 NOTE — Progress Notes (Signed)
Subjective:    Patient ID: Steven Mcgee, male    DOB: [birth year], 47 y.o.   MRN: [id number]   Chief Complaint: Medical Management of CHronic Issues  HPI:  1. Type 2 diabetes mellitus without complication, without long-term current use of insulin (HCC) last hgba1c was 10.7. He has always been very noncompliant with meds and diet. He came in in January for DOT physical and could not be passed because of his HGBA1c . This is the first time he has been back since then. He has ot changed his eating habits. We increased his janumet to 50/1000 BID in January.  2. Screening for cholesterol level  Has history of high triglycerides due to uncontrolled diabetes.  3. BMI 32.0-32.9,adult  no recent weight changes    Outpatient Encounter Medications as of 10/28/2017  Medication Sig  . glimepiride (AMARYL) 4 MG tablet TAKE 2 TABLETS BY MOUTH DAILY WITH BREAKFAST  . JANUMET 50-1000 MG tablet TAKE 1 TABLET BY MOUTH TWICE DAILY WITH FOOD  . ketoconazole (NIZORAL) 200 MG tablet Take 1 tablet (200 mg total) by mouth daily.      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  HENT: Negative.   Eyes: Negative for pain.  Respiratory: Negative for shortness of breath.   Cardiovascular: Negative for chest pain, palpitations and leg swelling.  Gastrointestinal: Negative for abdominal pain.  Endocrine: Negative for polydipsia.  Genitourinary: Negative.   Skin: Negative for rash.  Neurological: Negative for dizziness, weakness and headaches.  Hematological: Does not bruise/bleed easily.  Psychiatric/Behavioral: Negative.   All other systems reviewed and are negative.      Objective:   Physical Exam  Constitutional: He is oriented to person, place, and time.  HENT:  Head: Normocephalic.  Nose: Nose normal.  Mouth/Throat: Oropharynx is clear and moist.  Eyes:  Pupils are equal, round, and reactive to light. EOM are normal.  Neck: Normal range of motion and phonation normal. Neck supple. No JVD present. Carotid bruit is not present. No thyroid mass and no thyromegaly present.  Cardiovascular: Normal rate and regular rhythm.  Pulmonary/Chest: Effort normal and breath sounds normal. No respiratory distress.  Abdominal: Soft. Normal appearance, normal aorta and bowel sounds are normal. There is no tenderness.  Musculoskeletal: Normal range of motion.  Lymphadenopathy:    He has no cervical adenopathy.  Neurological: He is alert and oriented to person, place, and time.  Skin: Skin is warm and dry.  Psychiatric: Judgment normal.    BP 138/78   Pulse 86   Temp 98 F (36.7 C) (Oral)   Ht 5' 11"  (1.803 m)   Wt 233 lb (105.7 kg)   BMI 32.50 kg/m   HGBA1c 7.6%      Assessment & Plan:  Trev Boley comes in today with chief complaint of Medical Management of Chronic Issues (Refill nizoral tabs)   Diagnosis and orders addressed:  1. Type 2 diabetes mellitus without complication, without long-term current use of insulin (HCC) Doing better- watch carbs in diet - Bayer DCA Hb A1c Waived - CMP14+EGFR - Microalbumin / creatinine urine ratio - glimepiride (AMARYL) 4 MG tablet; TAKE 2 TABLETS BY MOUTH DAILY WITH BREAKFAST  Dispense: 180 tablet; Refill: 1 - sitaGLIPtin-metformin (JANUMET) 50-1000 MG tablet; Take 1 tablet by mouth 2 (two) times daily with a meal.  Dispense: 180 tablet; Refill: 1  2. Screening for cholesterol level Labs pending - Lipid panel  3. BMI 32.0-32.9,adult Discussed diet and exercise for person with BMI >25 Will recheck weight in 3-6 months   Labs pending Health Maintenance reviewed Diet and exercise encouraged  Follow up plan: 3 months   [name] Hassell Done, FNP

## 2017-10-29 LAB — MICROALBUMIN / CREATININE URINE RATIO
CREATININE, UR: 90 mg/dL
MICROALBUM., U, RANDOM: 50.6 ug/mL
Microalb/Creat Ratio: 56.2 mg/g creat — ABNORMAL HIGH (ref 0.0–30.0)

## 2017-10-29 LAB — CMP14+EGFR
A/G RATIO: 1.8 (ref 1.2–2.2)
ALK PHOS: 65 IU/L (ref 39–117)
ALT: 28 IU/L (ref 0–44)
AST: 22 IU/L (ref 0–40)
Albumin: 4.6 g/dL (ref 3.5–5.5)
BUN/Creatinine Ratio: 12 (ref 9–20)
BUN: 11 mg/dL (ref 6–24)
Bilirubin Total: 0.4 mg/dL (ref 0.0–1.2)
CALCIUM: 9.9 mg/dL (ref 8.7–10.2)
CO2: 22 mmol/L (ref 20–29)
Chloride: 99 mmol/L (ref 96–106)
Creatinine, Ser: 0.9 mg/dL (ref 0.76–1.27)
GFR calc Af Amer: 116 mL/min/{1.73_m2} (ref >59)
GFR, EST NON AFRICAN AMERICAN: 100 mL/min/{1.73_m2} (ref >59)
Globulin, Total: 2.6 g/dL (ref 1.5–4.5)
Glucose: 257 mg/dL — ABNORMAL HIGH (ref 65–99)
POTASSIUM: 4.6 mmol/L (ref 3.5–5.2)
SODIUM: 140 mmol/L (ref 134–144)
Total Protein: 7.2 g/dL (ref 6.0–8.5)

## 2017-10-29 LAB — LIPID PANEL
CHOLESTEROL TOTAL: 188 mg/dL (ref 100–199)
Chol/HDL Ratio: 5.4 ratio — ABNORMAL HIGH (ref 0.0–5.0)
HDL: 35 mg/dL — ABNORMAL LOW (ref >39)
TRIGLYCERIDES: 504 mg/dL — AB (ref 0–149)

## 2017-11-01 ENCOUNTER — Other Ambulatory Visit: Payer: Self-pay | Admitting: Nurse Practitioner

## 2017-11-01 DIAGNOSIS — R809 Proteinuria, unspecified: Secondary | ICD-10-CM

## 2017-11-01 MED ORDER — LISINOPRIL 10 MG PO TABS: 10.0000 mg | ORAL_TABLET | Freq: Every day | ORAL | 11 refills | Status: DC

## 2017-11-04 ENCOUNTER — Other Ambulatory Visit: Payer: Self-pay

## 2017-11-04 DIAGNOSIS — R809 Proteinuria, unspecified: Secondary | ICD-10-CM

## 2017-11-04 MED ORDER — LISINOPRIL 10 MG PO TABS: 10.0000 mg | ORAL_TABLET | Freq: Every day | ORAL | 2 refills | Status: DC

## 2018-01-23 ENCOUNTER — Other Ambulatory Visit: Payer: Self-pay | Admitting: Nurse Practitioner

## 2018-01-23 DIAGNOSIS — R809 Proteinuria, unspecified: Secondary | ICD-10-CM

## 2018-01-30 ENCOUNTER — Encounter: Payer: Self-pay | Admitting: Nurse Practitioner

## 2018-01-30 ENCOUNTER — Ambulatory Visit: Payer: BLUE CROSS/BLUE SHIELD | Admitting: Nurse Practitioner

## 2018-01-30 VITALS — BP 122/81 | HR 95 | Temp 97.9°F | Ht 71.0 in | Wt 234.4 lb

## 2018-01-30 DIAGNOSIS — Z6832 Body mass index (BMI) 32.0-32.9, adult: Secondary | ICD-10-CM | POA: Diagnosis not present

## 2018-01-30 DIAGNOSIS — E119 Type 2 diabetes mellitus without complications: Secondary | ICD-10-CM

## 2018-01-30 LAB — CMP14+EGFR
ALBUMIN: 4.6 g/dL (ref 3.5–5.5)
ALT: 42 IU/L (ref 0–44)
AST: 25 IU/L (ref 0–40)
Albumin/Globulin Ratio: 1.7 (ref 1.2–2.2)
Alkaline Phosphatase: 61 IU/L (ref 39–117)
BUN / CREAT RATIO: 12 (ref 9–20)
BUN: 11 mg/dL (ref 6–24)
Bilirubin Total: 0.5 mg/dL (ref 0.0–1.2)
CALCIUM: 9.9 mg/dL (ref 8.7–10.2)
CO2: 21 mmol/L (ref 20–29)
CREATININE: 0.91 mg/dL (ref 0.76–1.27)
Chloride: 99 mmol/L (ref 96–106)
GFR calc Af Amer: 114 mL/min/{1.73_m2} (ref >59)
GFR, EST NON AFRICAN AMERICAN: 99 mL/min/{1.73_m2} (ref >59)
GLOBULIN, TOTAL: 2.7 g/dL (ref 1.5–4.5)
Glucose: 287 mg/dL — ABNORMAL HIGH (ref 65–99)
Potassium: 4.8 mmol/L (ref 3.5–5.2)
SODIUM: 137 mmol/L (ref 134–144)
Total Protein: 7.3 g/dL (ref 6.0–8.5)

## 2018-01-30 LAB — BAYER DCA HB A1C WAIVED: HB A1C (BAYER DCA - WAIVED): 9.3 % — ABNORMAL HIGH (ref <7.0)

## 2018-01-30 MED ORDER — SITAGLIPTIN PHOS-METFORMIN HCL 50-1000 MG PO TABS: 1.0000 | ORAL_TABLET | Freq: Two times a day (BID) | ORAL | 1 refills | Status: DC

## 2018-01-30 MED ORDER — GLIMEPIRIDE 4 MG PO TABS: ORAL_TABLET | ORAL | 1 refills | Status: DC

## 2018-01-30 NOTE — Patient Instructions (Signed)
Diabetes Mellitus and Nutrition When you have diabetes (diabetes mellitus), it is very important to have healthy eating habits because your blood sugar (glucose) levels are greatly affected by what you eat and drink. Eating healthy foods in the appropriate amounts, at about the same times every day, can help you:  Control your blood glucose.  Lower your risk of heart disease.  Improve your blood pressure.  Reach or maintain a healthy weight.  Every person with diabetes is different, and each person has different needs for a meal plan. Your health care provider may recommend that you work with a diet and nutrition specialist (dietitian) to make a meal plan that is best for you. Your meal plan may vary depending on factors such as:  The calories you need.  The medicines you take.  Your weight.  Your blood glucose, blood pressure, and cholesterol levels.  Your activity level.  Other health conditions you have, such as heart or kidney disease.  How do carbohydrates affect me? Carbohydrates affect your blood glucose level more than any other type of food. Eating carbohydrates naturally increases the amount of glucose in your blood. Carbohydrate counting is a method for keeping track of how many carbohydrates you eat. Counting carbohydrates is important to keep your blood glucose at a healthy level, especially if you use insulin or take certain oral diabetes medicines. It is important to know how many carbohydrates you can safely have in each meal. This is different for every person. Your dietitian can help you calculate how many carbohydrates you should have at each meal and for snack. Foods that contain carbohydrates include:  Bread, cereal, rice, pasta, and crackers.  Potatoes and corn.  Peas, beans, and lentils.  Milk and yogurt.  Fruit and juice.  Desserts, such as cakes, cookies, ice cream, and candy.  How does alcohol affect me? Alcohol can cause a sudden decrease in blood  glucose (hypoglycemia), especially if you use insulin or take certain oral diabetes medicines. Hypoglycemia can be a life-threatening condition. Symptoms of hypoglycemia (sleepiness, dizziness, and confusion) are similar to symptoms of having too much alcohol. If your health care provider says that alcohol is safe for you, follow these guidelines:  Limit alcohol intake to no more than 1 drink per day for nonpregnant women and 2 drinks per day for men. One drink equals 12 oz of beer, 5 oz of wine, or 1 oz of hard liquor.  Do not drink on an empty stomach.  Keep yourself hydrated with water, diet soda, or unsweetened iced tea.  Keep in mind that regular soda, juice, and other mixers may contain a lot of sugar and must be counted as carbohydrates.  What are tips for following this plan? Reading food labels  Start by checking the serving size on the label. The amount of calories, carbohydrates, fats, and other nutrients listed on the label are based on one serving of the food. Many foods contain more than one serving per package.  Check the total grams (g) of carbohydrates in one serving. You can calculate the number of servings of carbohydrates in one serving by dividing the total carbohydrates by 15. For example, if a food has 30 g of total carbohydrates, it would be equal to 2 servings of carbohydrates.  Check the number of grams (g) of saturated and trans fats in one serving. Choose foods that have low or no amount of these fats.  Check the number of milligrams (mg) of sodium in one serving. Most people   should limit total sodium intake to less than 2,300 mg per day.  Always check the nutrition information of foods labeled as "low-fat" or "nonfat". These foods may be higher in added sugar or refined carbohydrates and should be avoided.  Talk to your dietitian to identify your daily goals for nutrients listed on the label. Shopping  Avoid buying canned, premade, or processed foods. These  foods tend to be high in fat, sodium, and added sugar.  Shop around the outside edge of the grocery store. This includes fresh fruits and vegetables, bulk grains, fresh meats, and fresh dairy. Cooking  Use low-heat cooking methods, such as baking, instead of high-heat cooking methods like deep frying.  Cook using healthy oils, such as olive, canola, or sunflower oil.  Avoid cooking with butter, cream, or high-fat meats. Meal planning  Eat meals and snacks regularly, preferably at the same times every day. Avoid going long periods of time without eating.  Eat foods high in fiber, such as fresh fruits, vegetables, beans, and whole grains. Talk to your dietitian about how many servings of carbohydrates you can eat at each meal.  Eat 4-6 ounces of lean protein each day, such as lean meat, chicken, fish, eggs, or tofu. 1 ounce is equal to 1 ounce of meat, chicken, or fish, 1 egg, or 1/4 cup of tofu.  Eat some foods each day that contain healthy fats, such as avocado, nuts, seeds, and fish. Lifestyle   Check your blood glucose regularly.  Exercise at least 30 minutes 5 or more days each week, or as told by your health care provider.  Take medicines as told by your health care provider.  Do not use any products that contain nicotine or tobacco, such as cigarettes and e-cigarettes. If you need help quitting, ask your health care provider.  Work with a counselor or diabetes educator to identify strategies to manage stress and any emotional and social challenges. What are some questions to ask my health care provider?  Do I need to meet with a diabetes educator?  Do I need to meet with a dietitian?  What number can I call if I have questions?  When are the best times to check my blood glucose? Where to find more information:  American Diabetes Association: diabetes.org/food-and-fitness/food  Academy of Nutrition and Dietetics:  www.eatright.org/resources/health/diseases-and-conditions/diabetes  National Institute of Diabetes and Digestive and Kidney Diseases (NIH): www.niddk.nih.gov/health-information/diabetes/overview/diet-eating-physical-activity Summary  A healthy meal plan will help you control your blood glucose and maintain a healthy lifestyle.  Working with a diet and nutrition specialist (dietitian) can help you make a meal plan that is best for you.  Keep in mind that carbohydrates and alcohol have immediate effects on your blood glucose levels. It is important to count carbohydrates and to use alcohol carefully. This information is not intended to replace advice given to you by your health care provider. Make sure you discuss any questions you have with your health care provider. Document Released: 02/18/2005 Document Revised: 06/28/2016 Document Reviewed: 06/28/2016 Elsevier Interactive Patient Education  2018 Elsevier Inc.  

## 2018-01-30 NOTE — Progress Notes (Signed)
Subjective:    Patient ID: Steven Mcgee, male    DOB: [birth year], 47 y.o.   MRN: [id number]   Chief Complaint: Medical Management of Chronic Issues   HPI:  1. Type 2 diabetes mellitus without complication, without long-term current use of insulin ([place]) last hgab1c was 7.6%. Patient does not check blood sugars at home. He also does not watch diet. He is very noncompliant and hgbac1 was go from 10.0-7.0 range depending on patients mood.  2. BMI 32.0-32.9,adult  No recent weight changes    Outpatient Encounter Medications as of 01/30/2018  Medication Sig  . glimepiride (AMARYL) 4 MG tablet TAKE 2 TABLETS BY MOUTH DAILY WITH BREAKFAST  . ketoconazole (NIZORAL) 200 MG tablet Take 1 tablet (200 mg total) by mouth daily.  Marland Kitchen lisinopril (PRINIVIL,ZESTRIL) 10 MG tablet TAKE 1 TABLET(10 MG) BY MOUTH DAILY  . sitaGLIPtin-metformin (JANUMET) 50-1000 MG tablet Take 1 tablet by mouth 2 (two) times daily with a meal.     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 Sees his daughter every other weekend.   Review of Systems  Constitutional: Negative for activity change and appetite change.  HENT: Negative.   Eyes: Negative for pain.  Respiratory: Negative for shortness of breath.   Cardiovascular: Negative for chest pain, palpitations and leg swelling.  Gastrointestinal: Negative for abdominal pain.  Endocrine: Negative for polydipsia.  Genitourinary: Negative.   Skin: Negative for rash.  Neurological: Negative for dizziness, weakness and headaches.  Hematological: Does not bruise/bleed easily.  Psychiatric/Behavioral: Negative.   All other systems reviewed and are negative.      Objective:   Physical Exam  Constitutional: He is oriented to person, place, and time. He appears well-developed and well-nourished.  HENT:  Head: Normocephalic.  Nose: Nose normal.  Mouth/Throat: Oropharynx is clear and moist.  Eyes: Pupils  are equal, round, and reactive to light. EOM are normal.  Neck: Normal range of motion and phonation normal. Neck supple. No JVD present. Carotid bruit is not present. No thyroid mass and no thyromegaly present.  Cardiovascular: Normal rate and regular rhythm.  Pulmonary/Chest: Effort normal and breath sounds normal. No respiratory distress.  Abdominal: Soft. Normal appearance, normal aorta and bowel sounds are normal. There is no tenderness.  Musculoskeletal: Normal range of motion.  Lymphadenopathy:    He has no cervical adenopathy.  Neurological: He is alert and oriented to person, place, and time.  Skin: Skin is warm and dry.  Psychiatric: He has a normal mood and affect. His behavior is normal. Judgment and thought content normal.  Nursing note and vitals reviewed.  BP 122/81   Pulse 95   Temp 97.9 F (36.6 C) (Oral)   Ht _0  (1.803 m)   Wt 234 lb 6.4 oz (106.3 kg)   BMI 32.69 kg/m      HGBA1C 9.3% Assessment & Plan:  Steven Mcgee comes in today with chief complaint of Medical Management of Chronic Issues   Diagnosis and orders addressed:  1. Type 2 diabetes mellitus without complication, without long-term current use of insulin ([place]) Start watching carbs again Refuses to chage meds - Bayer DCA Hb A1c Waived - CMP14+EGFR - Lipid panel - sitaGLIPtin-metformin (JANUMET) 50-1000 MG tablet; Take 1 tablet by mouth 2 (two) times daily with a meal.  Dispense: 180 tablet; Refill: 1 - glimepiride (AMARYL) 4 MG tablet; TAKE 2 TABLETS BY MOUTH DAILY WITH BREAKFAST  Dispense: 180 tablet; Refill:  1  2. BMI 32.0-32.9,adult Discussed diet and exercise for person with BMI >25 Will recheck weight in 3-6 months    Labs pending Health Maintenance reviewed Diet and exercise encouraged  Follow up plan: 3 months   [name] Hassell Done, FNP

## 2018-01-31 ENCOUNTER — Ambulatory Visit: Payer: BLUE CROSS/BLUE SHIELD | Admitting: Nurse Practitioner

## 2018-02-02 LAB — LIPID PANEL
CHOLESTEROL TOTAL: 181 mg/dL (ref 100–199)
Chol/HDL Ratio: 4.9 ratio (ref 0.0–5.0)
HDL: 37 mg/dL — ABNORMAL LOW (ref >39)
LDL Calculated: 66 mg/dL (ref 0–99)
TRIGLYCERIDES: 388 mg/dL — AB (ref 0–149)
VLDL Cholesterol Cal: 78 mg/dL — ABNORMAL HIGH (ref 5–40)

## 2018-02-02 LAB — SPECIMEN STATUS REPORT

## 2018-02-03 ENCOUNTER — Telehealth: Payer: Self-pay | Admitting: *Deleted

## 2018-02-03 MED ORDER — FENOFIBRATE 145 MG PO TABS: 145.0000 mg | ORAL_TABLET | Freq: Every day | ORAL | 5 refills | Status: DC

## 2018-02-03 NOTE — Telephone Encounter (Signed)
Willing to take fenofibrate.  Sent to pharmacy

## 2018-02-03 NOTE — Telephone Encounter (Signed)
-----   [facility], FNP sent at 02/02/2018  6:19 PM EDT ----- Conchita Parisrig are terrible directly related to blood sugars- needs to be on fenofibrate will he take blood sugar elevated Kidney and liver function stable Hgba1c discussed at appointment

## 2018-04-16 ENCOUNTER — Other Ambulatory Visit: Payer: Self-pay | Admitting: Nurse Practitioner

## 2018-04-16 DIAGNOSIS — R809 Proteinuria, unspecified: Secondary | ICD-10-CM

## 2018-05-01 ENCOUNTER — Ambulatory Visit: Payer: BLUE CROSS/BLUE SHIELD | Admitting: Nurse Practitioner

## 2018-05-01 ENCOUNTER — Encounter: Payer: Self-pay | Admitting: Nurse Practitioner

## 2018-05-01 VITALS — BP 133/82 | HR 85 | Temp 97.3°F | Ht 71.0 in | Wt 234.0 lb

## 2018-05-01 DIAGNOSIS — Z0000 Encounter for general adult medical examination without abnormal findings: Secondary | ICD-10-CM | POA: Diagnosis not present

## 2018-05-01 DIAGNOSIS — Z13220 Encounter for screening for lipoid disorders: Secondary | ICD-10-CM

## 2018-05-01 DIAGNOSIS — E119 Type 2 diabetes mellitus without complications: Secondary | ICD-10-CM

## 2018-05-01 DIAGNOSIS — R809 Proteinuria, unspecified: Secondary | ICD-10-CM

## 2018-05-01 DIAGNOSIS — Z6832 Body mass index (BMI) 32.0-32.9, adult: Secondary | ICD-10-CM

## 2018-05-01 LAB — CMP14+EGFR
A/G RATIO: 2.2 (ref 1.2–2.2)
ALBUMIN: 4.8 g/dL (ref 3.5–5.5)
ALK PHOS: 40 IU/L (ref 39–117)
ALT: 45 IU/L — ABNORMAL HIGH (ref 0–44)
AST: 30 IU/L (ref 0–40)
BILIRUBIN TOTAL: 0.5 mg/dL (ref 0.0–1.2)
BUN/Creatinine Ratio: 12 (ref 9–20)
BUN: 14 mg/dL (ref 6–24)
CHLORIDE: 98 mmol/L (ref 96–106)
CO2: 22 mmol/L (ref 20–29)
Calcium: 9.9 mg/dL (ref 8.7–10.2)
Creatinine, Ser: 1.19 mg/dL (ref 0.76–1.27)
GFR calc non Af Amer: 71 mL/min/{1.73_m2} (ref >59)
GFR, EST AFRICAN AMERICAN: 82 mL/min/{1.73_m2} (ref >59)
Globulin, Total: 2.2 g/dL (ref 1.5–4.5)
Glucose: 236 mg/dL — ABNORMAL HIGH (ref 65–99)
Potassium: 4 mmol/L (ref 3.5–5.2)
SODIUM: 138 mmol/L (ref 134–144)
TOTAL PROTEIN: 7 g/dL (ref 6.0–8.5)

## 2018-05-01 LAB — URINALYSIS
BILIRUBIN UA: NEGATIVE
Ketones, UA: NEGATIVE
Leukocytes, UA: NEGATIVE
Nitrite, UA: NEGATIVE
PH UA: 5.5 (ref 5.0–7.5)
PROTEIN UA: NEGATIVE
RBC, UA: NEGATIVE
SPEC GRAV UA: 1.02 (ref 1.005–1.030)
UUROB: 0.2 mg/dL (ref 0.2–1.0)

## 2018-05-01 LAB — BAYER DCA HB A1C WAIVED: HB A1C (BAYER DCA - WAIVED): 8.9 % — ABNORMAL HIGH (ref <7.0)

## 2018-05-01 LAB — LIPID PANEL
CHOLESTEROL TOTAL: 168 mg/dL (ref 100–199)
Chol/HDL Ratio: 4.1 ratio (ref 0.0–5.0)
HDL: 41 mg/dL (ref >39)
LDL Calculated: 92 mg/dL (ref 0–99)
Triglycerides: 177 mg/dL — ABNORMAL HIGH (ref 0–149)
VLDL CHOLESTEROL CAL: 35 mg/dL (ref 5–40)

## 2018-05-01 MED ORDER — FENOFIBRATE 145 MG PO TABS: 145.0000 mg | ORAL_TABLET | Freq: Every day | ORAL | 5 refills | Status: DC

## 2018-05-01 MED ORDER — LISINOPRIL 10 MG PO TABS: ORAL_TABLET | ORAL | 1 refills | Status: DC

## 2018-05-01 MED ORDER — GLIMEPIRIDE 4 MG PO TABS: ORAL_TABLET | ORAL | 1 refills | Status: DC

## 2018-05-01 MED ORDER — SITAGLIPTIN PHOS-METFORMIN HCL 50-1000 MG PO TABS: 1.0000 | ORAL_TABLET | Freq: Two times a day (BID) | ORAL | 1 refills | Status: DC

## 2018-05-01 NOTE — Progress Notes (Signed)
Subjective:    Patient ID: Steven Mcgee, male    DOB: 07-16-1970, 47 y.o.   MRN: [id number]   Chief Complaint: Annual Exam   HPI:  1. Type 2 diabetes mellitus without complication, without long-term current use of insulin ([place]) last hgba1c was 9.3%. He is not compliant with his diet or exercise. He doe snot check blood sugars   2. Screening for cholesterol level  His trig were elevated at last visit.  3. Annual physical exam   4. BMI 32.0-32.9,adult  No recent weight changes    Outpatient Encounter Medications as of 05/01/2018  Medication Sig  . fenofibrate (TRICOR) 145 MG tablet Take 1 tablet (145 mg total) by mouth daily.  Marland Kitchen glimepiride (AMARYL) 4 MG tablet TAKE 2 TABLETS BY MOUTH DAILY WITH BREAKFAST  . ketoconazole (NIZORAL) 200 MG tablet Take 1 tablet (200 mg total) by mouth daily.  Marland Kitchen lisinopril (PRINIVIL,ZESTRIL) 10 MG tablet TAKE 1 TABLET(10 MG) BY MOUTH DAILY  . sitaGLIPtin-metformin (JANUMET) 50-1000 MG tablet Take 1 tablet by mouth 2 (two) times daily with a meal.      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   Review of Systems  Constitutional: Negative for activity change and appetite change.  HENT: Negative.   Eyes: Negative for pain.  Respiratory: Negative for shortness of breath.   Cardiovascular: Negative for chest pain, palpitations and leg swelling.  Gastrointestinal: Negative for abdominal pain.  Endocrine: Negative for polydipsia.  Genitourinary: Negative.   Skin: Negative for rash.  Neurological: Negative for dizziness, weakness and headaches.  Hematological: Does not bruise/bleed easily.  Psychiatric/Behavioral: Negative.   All other systems reviewed and are negative.      Objective:   Physical Exam  Constitutional: He is oriented to person, place, and time. He appears well-developed and well-nourished.  HENT:  Head: Normocephalic.  Nose: Nose normal.  Mouth/Throat: Oropharynx is clear and  moist.  Eyes: Pupils are equal, round, and reactive to light. EOM are normal.  Neck: Normal range of motion and phonation normal. Neck supple. No JVD present. Carotid bruit is not present. No thyroid mass and no thyromegaly present.  Cardiovascular: Normal rate and regular rhythm.  Pulmonary/Chest: Effort normal and breath sounds normal. No respiratory distress.  Abdominal: Soft. Normal appearance, normal aorta and bowel sounds are normal. There is no tenderness.  Musculoskeletal: Normal range of motion.  Lymphadenopathy:    He has no cervical adenopathy.  Neurological: He is alert and oriented to person, place, and time.  Skin: Skin is warm and dry.  Psychiatric: He has a normal mood and affect. His behavior is normal. Judgment and thought content normal.  Nursing note and vitals reviewed.   BP 133/82   Pulse 85   Temp (!) 97.3 F (36.3 C) (Oral)   Ht 5' 11"  (1.803 m)   Wt 234 lb (106.1 kg)   BMI 32.64 kg/m        Assessment & Plan:  Steven Mcgee comes in today with chief complaint of Annual Exam   Diagnosis and orders addressed:  1. Type 2 diabetes mellitus without complication, without long-term current use of insulin (HCC) strciter carb counting No med changes to do- patient wants to do diet control - Bayer DCA Hb A1c Waived - CMP14+EGFR - sitaGLIPtin-metformin (JANUMET) 50-1000 MG tablet; Take 1 tablet by mouth 2 (two) times daily with a meal.  Dispense: 180 tablet; Refill: 1 - glimepiride (AMARYL) 4 MG tablet; TAKE 2  TABLETS BY MOUTH DAILY WITH BREAKFAST  Dispense: 180 tablet; Refill: 1  2. Screening for cholesterol level Low fat diet - Lipid panel - fenofibrate (TRICOR) 145 MG tablet; Take 1 tablet (145 mg total) by mouth daily.  Dispense: 30 tablet; Refill: 5  3. Annual physical exam - Urinalysis  4. BMI 32.0-32.9,adult Discussed diet and exercise for person with BMI >25 Will recheck weight in 3-6 months  5. Microalbuminuria - lisinopril  (PRINIVIL,ZESTRIL) 10 MG tablet; TAKE 1 TABLET(10 MG) BY MOUTH DAILY  Dispense: 90 tablet; Refill: 1   Labs pending Health Maintenance reviewed Diet and exercise encouraged  Follow up plan: 3 months   [name] Hassell Done, FNP

## 2018-05-01 NOTE — Patient Instructions (Signed)
Diabetes Mellitus and Nutrition When you have diabetes (diabetes mellitus), it is very important to have healthy eating habits because your blood sugar (glucose) levels are greatly affected by what you eat and drink. Eating healthy foods in the appropriate amounts, at about the same times every day, can help you:  Control your blood glucose.  Lower your risk of heart disease.  Improve your blood pressure.  Reach or maintain a healthy weight.  Every person with diabetes is different, and each person has different needs for a meal plan. Your health care provider may recommend that you work with a diet and nutrition specialist (dietitian) to make a meal plan that is best for you. Your meal plan may vary depending on factors such as:  The calories you need.  The medicines you take.  Your weight.  Your blood glucose, blood pressure, and cholesterol levels.  Your activity level.  Other health conditions you have, such as heart or kidney disease.  How do carbohydrates affect me? Carbohydrates affect your blood glucose level more than any other type of food. Eating carbohydrates naturally increases the amount of glucose in your blood. Carbohydrate counting is a method for keeping track of how many carbohydrates you eat. Counting carbohydrates is important to keep your blood glucose at a healthy level, especially if you use insulin or take certain oral diabetes medicines. It is important to know how many carbohydrates you can safely have in each meal. This is different for every person. Your dietitian can help you calculate how many carbohydrates you should have at each meal and for snack. Foods that contain carbohydrates include:  Bread, cereal, rice, pasta, and crackers.  Potatoes and corn.  Peas, beans, and lentils.  Milk and yogurt.  Fruit and juice.  Desserts, such as cakes, cookies, ice cream, and candy.  How does alcohol affect me? Alcohol can cause a sudden decrease in blood  glucose (hypoglycemia), especially if you use insulin or take certain oral diabetes medicines. Hypoglycemia can be a life-threatening condition. Symptoms of hypoglycemia (sleepiness, dizziness, and confusion) are similar to symptoms of having too much alcohol. If your health care provider says that alcohol is safe for you, follow these guidelines:  Limit alcohol intake to no more than 1 drink per day for nonpregnant women and 2 drinks per day for men. One drink equals 12 oz of beer, 5 oz of wine, or 1 oz of hard liquor.  Do not drink on an empty stomach.  Keep yourself hydrated with water, diet soda, or unsweetened iced tea.  Keep in mind that regular soda, juice, and other mixers may contain a lot of sugar and must be counted as carbohydrates.  What are tips for following this plan? Reading food labels  Start by checking the serving size on the label. The amount of calories, carbohydrates, fats, and other nutrients listed on the label are based on one serving of the food. Many foods contain more than one serving per package.  Check the total grams (g) of carbohydrates in one serving. You can calculate the number of servings of carbohydrates in one serving by dividing the total carbohydrates by 15. For example, if a food has 30 g of total carbohydrates, it would be equal to 2 servings of carbohydrates.  Check the number of grams (g) of saturated and trans fats in one serving. Choose foods that have low or no amount of these fats.  Check the number of milligrams (mg) of sodium in one serving. Most people   should limit total sodium intake to less than 2,300 mg per day.  Always check the nutrition information of foods labeled as "low-fat" or "nonfat". These foods may be higher in added sugar or refined carbohydrates and should be avoided.  Talk to your dietitian to identify your daily goals for nutrients listed on the label. Shopping  Avoid buying canned, premade, or processed foods. These  foods tend to be high in fat, sodium, and added sugar.  Shop around the outside edge of the grocery store. This includes fresh fruits and vegetables, bulk grains, fresh meats, and fresh dairy. Cooking  Use low-heat cooking methods, such as baking, instead of high-heat cooking methods like deep frying.  Cook using healthy oils, such as olive, canola, or sunflower oil.  Avoid cooking with butter, cream, or high-fat meats. Meal planning  Eat meals and snacks regularly, preferably at the same times every day. Avoid going long periods of time without eating.  Eat foods high in fiber, such as fresh fruits, vegetables, beans, and whole grains. Talk to your dietitian about how many servings of carbohydrates you can eat at each meal.  Eat 4-6 ounces of lean protein each day, such as lean meat, chicken, fish, eggs, or tofu. 1 ounce is equal to 1 ounce of meat, chicken, or fish, 1 egg, or 1/4 cup of tofu.  Eat some foods each day that contain healthy fats, such as avocado, nuts, seeds, and fish. Lifestyle   Check your blood glucose regularly.  Exercise at least 30 minutes 5 or more days each week, or as told by your health care provider.  Take medicines as told by your health care provider.  Do not use any products that contain nicotine or tobacco, such as cigarettes and e-cigarettes. If you need help quitting, ask your health care provider.  Work with a counselor or diabetes educator to identify strategies to manage stress and any emotional and social challenges. What are some questions to ask my health care provider?  Do I need to meet with a diabetes educator?  Do I need to meet with a dietitian?  What number can I call if I have questions?  When are the best times to check my blood glucose? Where to find more information:  American Diabetes Association: diabetes.org/food-and-fitness/food  Academy of Nutrition and Dietetics:  www.eatright.org/resources/health/diseases-and-conditions/diabetes  National Institute of Diabetes and Digestive and Kidney Diseases (NIH): www.niddk.nih.gov/health-information/diabetes/overview/diet-eating-physical-activity Summary  A healthy meal plan will help you control your blood glucose and maintain a healthy lifestyle.  Working with a diet and nutrition specialist (dietitian) can help you make a meal plan that is best for you.  Keep in mind that carbohydrates and alcohol have immediate effects on your blood glucose levels. It is important to count carbohydrates and to use alcohol carefully. This information is not intended to replace advice given to you by your health care provider. Make sure you discuss any questions you have with your health care provider. Document Released: 02/18/2005 Document Revised: 06/28/2016 Document Reviewed: 06/28/2016 Elsevier Interactive Patient Education  2018 Elsevier Inc.  

## 2018-08-14 ENCOUNTER — Ambulatory Visit: Payer: BLUE CROSS/BLUE SHIELD | Admitting: Nurse Practitioner

## 2018-08-29 ENCOUNTER — Telehealth: Payer: Self-pay | Admitting: Nurse Practitioner

## 2018-08-29 NOTE — Telephone Encounter (Signed)
I cannot answer that question. Last HGBA1c was 8.9 and that was in November. He is past due for follow up. If hgba1c  Is no better then answer is,  no need to stay on januvia

## 2018-08-29 NOTE — Telephone Encounter (Signed)
Samples given to patient. His insurance will start April 1 and he will make an appt to come in after that

## 2018-10-12 ENCOUNTER — Encounter: Payer: Self-pay | Admitting: Nurse Practitioner

## 2018-10-12 ENCOUNTER — Other Ambulatory Visit: Payer: Self-pay

## 2018-10-12 ENCOUNTER — Ambulatory Visit (INDEPENDENT_AMBULATORY_CARE_PROVIDER_SITE_OTHER): Payer: BLUE CROSS/BLUE SHIELD | Admitting: Nurse Practitioner

## 2018-10-12 VITALS — BP 125/83 | HR 91 | Temp 97.5°F | Ht 71.0 in | Wt 230.0 lb

## 2018-10-12 DIAGNOSIS — E1169 Type 2 diabetes mellitus with other specified complication: Secondary | ICD-10-CM | POA: Diagnosis not present

## 2018-10-12 DIAGNOSIS — E785 Hyperlipidemia, unspecified: Secondary | ICD-10-CM | POA: Diagnosis not present

## 2018-10-12 DIAGNOSIS — Z6832 Body mass index (BMI) 32.0-32.9, adult: Secondary | ICD-10-CM

## 2018-10-12 DIAGNOSIS — R809 Proteinuria, unspecified: Secondary | ICD-10-CM

## 2018-10-12 DIAGNOSIS — E119 Type 2 diabetes mellitus without complications: Secondary | ICD-10-CM | POA: Diagnosis not present

## 2018-10-12 DIAGNOSIS — Z13220 Encounter for screening for lipoid disorders: Secondary | ICD-10-CM

## 2018-10-12 LAB — BAYER DCA HB A1C WAIVED: HB A1C (BAYER DCA - WAIVED): 8.5 % — ABNORMAL HIGH (ref <7.0)

## 2018-10-12 MED ORDER — FENOFIBRATE 145 MG PO TABS: 145.0000 mg | ORAL_TABLET | Freq: Every day | ORAL | 1 refills | Status: DC

## 2018-10-12 MED ORDER — GLIMEPIRIDE 4 MG PO TABS: ORAL_TABLET | ORAL | 1 refills | Status: DC

## 2018-10-12 MED ORDER — SITAGLIPTIN PHOS-METFORMIN HCL 50-1000 MG PO TABS: 1.0000 | ORAL_TABLET | Freq: Two times a day (BID) | ORAL | 1 refills | Status: DC

## 2018-10-12 MED ORDER — LISINOPRIL 10 MG PO TABS: ORAL_TABLET | ORAL | 1 refills | Status: DC

## 2018-10-12 NOTE — Progress Notes (Signed)
Subjective:    Patient ID: Steven Mcgee, male    DOB: April 06, 1971, 48 y.o.   MRN: [id number]   Chief Complaint: medical management of chronic issue   HPI:  1. Type 2 diabetes mellitus without complication, without long-term current use of insulin (HCC) Last HGBA1c was 8.9. he ois very noncomplaint with diet and exercise. He does however takes his meds. His excuse is " we all have to die from something." he thinks it is a joke despite numberous conversations about his diet  2. Hyperlipidemia associated with type 2 diabetes mellitus ([place]) Again he does not watch carbs in diet so therefore his trig are very high.  3. Positive for microalbuminuria He is on lisinopril and we will recheck labs today  4. BMI 32.0-32.9,adult No recent weight changes    Outpatient Encounter Medications as of 10/12/2018  Medication Sig  . fenofibrate (TRICOR) 145 MG tablet Take 1 tablet (145 mg total) by mouth daily.  Marland Kitchen glimepiride (AMARYL) 4 MG tablet TAKE 2 TABLETS BY MOUTH DAILY WITH BREAKFAST  . ketoconazole (NIZORAL) 200 MG tablet Take 1 tablet (200 mg total) by mouth daily.  Marland Kitchen lisinopril (PRINIVIL,ZESTRIL) 10 MG tablet TAKE 1 TABLET(10 MG) BY MOUTH DAILY  . sitaGLIPtin-metformin (JANUMET) 50-1000 MG tablet Take 1 tablet by mouth 2 (two) times daily with a meal.      New complaints: None today  Social history:  Is a local truck driver    Review of Systems  Constitutional: Negative for activity change and appetite change.  HENT: Negative.   Eyes: Negative for pain.  Respiratory: Negative for shortness of breath.   Cardiovascular: Negative for chest pain, palpitations and leg swelling.  Gastrointestinal: Negative for abdominal pain.  Endocrine: Negative for polydipsia.  Genitourinary: Negative.   Skin: Negative for rash.  Neurological: Negative for dizziness, weakness and headaches.  Hematological: Does not bruise/bleed easily.  Psychiatric/Behavioral: Negative.   All other  systems reviewed and are negative.      Objective:   Physical Exam Vitals signs and nursing note reviewed.  Constitutional:      Appearance: Normal appearance. He is well-developed.  HENT:     Head: Normocephalic.     Nose: Nose normal.  Eyes:     Pupils: Pupils are equal, round, and reactive to light.  Neck:     Musculoskeletal: Normal range of motion and neck supple.     Thyroid: No thyroid mass or thyromegaly.     Vascular: No carotid bruit or JVD.     Trachea: Phonation normal.  Cardiovascular:     Rate and Rhythm: Normal rate and regular rhythm.  Pulmonary:     Effort: Pulmonary effort is normal. No respiratory distress.     Breath sounds: Normal breath sounds.  Abdominal:     General: Bowel sounds are normal.     Palpations: Abdomen is soft.     Tenderness: There is no abdominal tenderness.  Musculoskeletal: Normal range of motion.  Lymphadenopathy:     Cervical: No cervical adenopathy.  Skin:    General: Skin is warm and dry.  Neurological:     Mental Status: He is alert and oriented to person, place, and time.  Psychiatric:        Behavior: Behavior normal.        Thought Content: Thought content normal.        Judgment: Judgment normal.    BP 125/83   Pulse 91   Temp (!) 97.5 F (36.4 C) (Oral)  Ht 5' 11"  (1.803 m)   Wt 230 lb (104.3 kg)   BMI 32.08 kg/m   HGBA1c 8.5% down from 8.9%       Assessment & Plan:  Steven Mcgee comes in today with chief complaint of Medical Management of Chronic Issues   Diagnosis and orders addressed:  1. Type 2 diabetes mellitus without complication, without long-term current use of insulin (HCC) Stricter carb counting - Bayer DCA Hb A1c Waived - CMP14+EGFR - sitaGLIPtin-metformin (JANUMET) 50-1000 MG tablet; Take 1 tablet by mouth 2 (two) times daily with a meal.  Dispense: 180 tablet; Refill: 1 - glimepiride (AMARYL) 4 MG tablet; TAKE 2 TABLETS BY MOUTH DAILY WITH BREAKFAST  Dispense: 180 tablet; Refill:  1  2. Hyperlipidemia associated with type 2 diabetes mellitus (HCC) Low fat diet - Lipid panel  3. Microalbuminuria - lisinopril (ZESTRIL) 10 MG tablet; TAKE 1 TABLET(10 MG) BY MOUTH DAILY  Dispense: 90 tablet; Refill: 1  4. BMI 32.0-32.9,adult Discussed diet and exercise for person with BMI >25 Will recheck weight in 3-6 months  5. Screening for cholesterol level Low fat diet and exercise encouraged - fenofibrate (TRICOR) 145 MG tablet; Take 1 tablet (145 mg total) by mouth daily.  Dispense: 90 tablet; Refill: 1   Previous lbs reviewed Health Maintenance reviewed Diet and exercise encouraged  Follow up plan: 3 months   [name] Hassell Done, FNP

## 2018-10-13 LAB — CMP14+EGFR
ALT: 39 IU/L (ref 0–44)
AST: 25 IU/L (ref 0–40)
Albumin/Globulin Ratio: 2.1 (ref 1.2–2.2)
Albumin: 5.1 g/dL — ABNORMAL HIGH (ref 4.0–5.0)
Alkaline Phosphatase: 40 IU/L (ref 39–117)
BUN/Creatinine Ratio: 12 (ref 9–20)
BUN: 17 mg/dL (ref 6–24)
Bilirubin Total: 0.5 mg/dL (ref 0.0–1.2)
CO2: 23 mmol/L (ref 20–29)
Calcium: 10.2 mg/dL (ref 8.7–10.2)
Chloride: 100 mmol/L (ref 96–106)
Creatinine, Ser: 1.4 mg/dL — ABNORMAL HIGH (ref 0.76–1.27)
GFR calc Af Amer: 67 mL/min/{1.73_m2} (ref >59)
GFR calc non Af Amer: 58 mL/min/{1.73_m2} — ABNORMAL LOW (ref >59)
Globulin, Total: 2.4 g/dL (ref 1.5–4.5)
Glucose: 135 mg/dL — ABNORMAL HIGH (ref 65–99)
Potassium: 4.3 mmol/L (ref 3.5–5.2)
Sodium: 140 mmol/L (ref 134–144)
Total Protein: 7.5 g/dL (ref 6.0–8.5)

## 2018-10-13 LAB — LIPID PANEL
Chol/HDL Ratio: 4.5 ratio (ref 0.0–5.0)
Cholesterol, Total: 189 mg/dL (ref 100–199)
HDL: 42 mg/dL (ref >39)
LDL Calculated: 105 mg/dL — ABNORMAL HIGH (ref 0–99)
Triglycerides: 210 mg/dL — ABNORMAL HIGH (ref 0–149)
VLDL Cholesterol Cal: 42 mg/dL — ABNORMAL HIGH (ref 5–40)

## 2019-01-11 ENCOUNTER — Other Ambulatory Visit: Payer: Self-pay | Admitting: Nurse Practitioner

## 2019-01-11 DIAGNOSIS — Z13220 Encounter for screening for lipoid disorders: Secondary | ICD-10-CM

## 2019-01-12 ENCOUNTER — Encounter: Payer: Self-pay | Admitting: Nurse Practitioner

## 2019-01-12 ENCOUNTER — Ambulatory Visit (INDEPENDENT_AMBULATORY_CARE_PROVIDER_SITE_OTHER): Payer: BC Managed Care – PPO | Admitting: Nurse Practitioner

## 2019-01-12 DIAGNOSIS — E1169 Type 2 diabetes mellitus with other specified complication: Secondary | ICD-10-CM

## 2019-01-12 DIAGNOSIS — E785 Hyperlipidemia, unspecified: Secondary | ICD-10-CM | POA: Diagnosis not present

## 2019-01-12 DIAGNOSIS — R809 Proteinuria, unspecified: Secondary | ICD-10-CM | POA: Diagnosis not present

## 2019-01-12 DIAGNOSIS — E119 Type 2 diabetes mellitus without complications: Secondary | ICD-10-CM | POA: Diagnosis not present

## 2019-01-12 DIAGNOSIS — Z6832 Body mass index (BMI) 32.0-32.9, adult: Secondary | ICD-10-CM | POA: Diagnosis not present

## 2019-01-12 LAB — BAYER DCA HB A1C WAIVED: HB A1C (BAYER DCA - WAIVED): 10.6 % — ABNORMAL HIGH (ref <7.0)

## 2019-01-12 NOTE — Progress Notes (Signed)
Virtual Visit via telephone Note Due to COVID-19 pandemic this visit was conducted virtually. This visit type was conducted due to national recommendations for restrictions regarding the COVID-19 Pandemic (e.g. social distancing, sheltering in place) in an effort to limit this patient's exposure and mitigate transmission in our community. All issues noted in this document were discussed and addressed.  A physical exam was not performed with this format.  I connected with Steven Mcgee on 01/12/19 at 3:15 by telephone and verified that I am speaking with the correct person using two identifiers. Steven Mcgee is currently located at driving and no one is currently with her during visit. The provider, [name] Hassell Done, FNP is located in their office at time of visit.  I discussed the limitations, risks, security and privacy concerns of performing an evaluation and management service by telephone and the availability of in person appointments. I also discussed with the patient that there may be a patient responsible charge related to this service. The patient expressed understanding and agreed to proceed.   History and Present Illness:   Chief Complaint: Medical Management of Chronic Issues    HPI:  1. Hyperlipidemia associated with type 2 diabetes mellitus ([place]) Does not watch diet and does very little exercsie. Was started on fenofibrate at last visit and is tolerating it well.  2. Type 2 diabetes mellitus without complication, without long-term current use of insulin (HCC) Lat hgba1c was 8.5%. patient refused medication changes- he says he can get it down on his own. He does not check his blood sugars at home.  3. microalbumin Is on lisinopril without any complications  4. BMI 32.0-32.9,adult No recent weight chnages    Outpatient Encounter Medications as of 01/12/2019  Medication Sig  . fenofibrate (TRICOR) 145 MG tablet TAKE 1 TABLET(145 MG) BY MOUTH DAILY  .  glimepiride (AMARYL) 4 MG tablet TAKE 2 TABLETS BY MOUTH DAILY WITH BREAKFAST  . ketoconazole (NIZORAL) 200 MG tablet Take 1 tablet (200 mg total) by mouth daily.  Marland Kitchen lisinopril (ZESTRIL) 10 MG tablet TAKE 1 TABLET(10 MG) BY MOUTH DAILY  . sitaGLIPtin-metformin (JANUMET) 50-1000 MG tablet Take 1 tablet by mouth 2 (two) times daily with a meal.     Past Surgical History:  Procedure Laterality Date  . APPENDECTOMY      Family History  Problem Relation Age of Onset  . 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  Eyes: Negative for blurred vision, double vision and pain.  Respiratory: Negative for shortness of breath.   Cardiovascular: Negative for chest pain, palpitations, orthopnea and leg swelling.  Gastrointestinal: Negative for abdominal pain.  Skin: Negative for rash.  Neurological: Negative for dizziness, sensory change, loss of consciousness, weakness and headaches.  Endo/Heme/Allergies: Negative for polydipsia. Does not bruise/bleed easily.  Psychiatric/Behavioral: Negative for memory loss. The patient does not have insomnia.   All other systems reviewed and are negative.    Observations/Objective: Alert and oriented- answers all questions appropriately No distress  Assessment and Plan: Steven Mcgee comes in today with chief complaint of Medical Management of Chronic Issues   Diagnosis and orders addressed:  1. Hyperlipidemia associated with type 2 diabetes mellitus ([place]) Low fat diet Encouraged to exercise when he can - CMP14+EGFR - Lipid panel  2. Type  2 diabetes mellitus without complication, without long-term current use of insulin (HCC) Strict carb counting Will call with lab results - Bayer DCA Hb A1c Waived  3. BMI 32.0-32.9,adult Discussed diet and exercise for  person with BMI >25 Will recheck weight in 3-6 months  4. Microalbuminuria continue lisinopril as ordered   Labs pending Health Maintenance reviewed Diet and exercise encouraged  Follow up plan: 3 months      I discussed the assessment and treatment plan with the patient. The patient was provided an opportunity to ask questions and all were answered. The patient agreed with the plan and demonstrated an understanding of the instructions.   The patient was advised to call back or seek an in-person evaluation if the symptoms worsen or if the condition fails to improve as anticipated.  The above assessment and management plan was discussed with the patient. The patient verbalized understanding of and has agreed to the management plan. Patient is aware to call the clinic if symptoms persist or worsen. Patient is aware when to return to the clinic for a follow-up visit. Patient educated on when it is appropriate to go to the emergency department.   Time call ended:  3:28  I provided 13 minutes of non-face-to-face time during this encounter.    [name] Hassell Done, FNP

## 2019-01-13 LAB — CMP14+EGFR
ALT: 56 IU/L — ABNORMAL HIGH (ref 0–44)
AST: 30 IU/L (ref 0–40)
Albumin/Globulin Ratio: 2.4 — ABNORMAL HIGH (ref 1.2–2.2)
Albumin: 5.1 g/dL — ABNORMAL HIGH (ref 4.0–5.0)
Alkaline Phosphatase: 42 IU/L (ref 39–117)
BUN/Creatinine Ratio: 12 (ref 9–20)
BUN: 16 mg/dL (ref 6–24)
Bilirubin Total: 0.4 mg/dL (ref 0.0–1.2)
CO2: 23 mmol/L (ref 20–29)
Calcium: 9.9 mg/dL (ref 8.7–10.2)
Chloride: 99 mmol/L (ref 96–106)
Creatinine, Ser: 1.34 mg/dL — ABNORMAL HIGH (ref 0.76–1.27)
GFR calc Af Amer: 71 mL/min/{1.73_m2} (ref >59)
GFR calc non Af Amer: 61 mL/min/{1.73_m2} (ref >59)
Globulin, Total: 2.1 g/dL (ref 1.5–4.5)
Glucose: 360 mg/dL — ABNORMAL HIGH (ref 65–99)
Potassium: 4.6 mmol/L (ref 3.5–5.2)
Sodium: 140 mmol/L (ref 134–144)
Total Protein: 7.2 g/dL (ref 6.0–8.5)

## 2019-01-13 LAB — LIPID PANEL
Chol/HDL Ratio: 5.2 ratio — ABNORMAL HIGH (ref 0.0–5.0)
Cholesterol, Total: 206 mg/dL — ABNORMAL HIGH (ref 100–199)
HDL: 40 mg/dL (ref >39)
LDL Calculated: 113 mg/dL — ABNORMAL HIGH (ref 0–99)
Triglycerides: 263 mg/dL — ABNORMAL HIGH (ref 0–149)
VLDL Cholesterol Cal: 53 mg/dL — ABNORMAL HIGH (ref 5–40)

## 2019-01-15 ENCOUNTER — Telehealth: Payer: Self-pay | Admitting: Nurse Practitioner

## 2019-01-16 ENCOUNTER — Other Ambulatory Visit: Payer: Self-pay | Admitting: Nurse Practitioner

## 2019-01-16 MED ORDER — ROSUVASTATIN CALCIUM 10 MG PO TABS: 10.0000 mg | ORAL_TABLET | Freq: Every day | ORAL | 3 refills | Status: DC

## 2019-01-16 NOTE — Telephone Encounter (Signed)
Pt aware and crestor 10 sent - per MMM request

## 2019-02-08 ENCOUNTER — Other Ambulatory Visit: Payer: Self-pay

## 2019-02-09 ENCOUNTER — Ambulatory Visit (INDEPENDENT_AMBULATORY_CARE_PROVIDER_SITE_OTHER): Payer: BC Managed Care – PPO | Admitting: Nurse Practitioner

## 2019-02-09 ENCOUNTER — Encounter: Payer: Self-pay | Admitting: Nurse Practitioner

## 2019-02-09 ENCOUNTER — Other Ambulatory Visit: Payer: Self-pay

## 2019-02-09 VITALS — BP 115/72 | HR 85 | Temp 98.0°F | Ht 71.0 in | Wt 229.0 lb

## 2019-02-09 DIAGNOSIS — Z6832 Body mass index (BMI) 32.0-32.9, adult: Secondary | ICD-10-CM | POA: Diagnosis not present

## 2019-02-09 DIAGNOSIS — E785 Hyperlipidemia, unspecified: Secondary | ICD-10-CM

## 2019-02-09 DIAGNOSIS — E1169 Type 2 diabetes mellitus with other specified complication: Secondary | ICD-10-CM

## 2019-02-09 DIAGNOSIS — E119 Type 2 diabetes mellitus without complications: Secondary | ICD-10-CM | POA: Diagnosis not present

## 2019-02-09 LAB — BAYER DCA HB A1C WAIVED: HB A1C (BAYER DCA - WAIVED): 11.3 % — ABNORMAL HIGH (ref <7.0)

## 2019-02-09 MED ORDER — FARXIGA 10 MG PO TABS: 10.0000 mg | ORAL_TABLET | Freq: Every day | ORAL | 2 refills | Status: DC

## 2019-02-09 NOTE — Progress Notes (Deleted)
   Subjective:    Patient ID: Steven Mcgee, male    DOB: June 07, 1971, 48 y.o.   MRN: [id number]  HPI    Review of Systems     Objective:   Physical Exam        Assessment & Plan:

## 2019-02-09 NOTE — Progress Notes (Signed)
Subjective:    Patient ID: Steven Mcgee, male    DOB: [birth year], 48 y.o.   MRN: [id number]   Chief Complaint: Medical Management of Chronic Issues    HPI:  1. Type 2 diabetes mellitus without complication, without long-term current use of insulin (HCC) Is not compliant with diet. He does very little exercise. 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 Lab Results  Component Value Date   HGBA1C 10.6 (H) 01/12/2019     2. Hyperlipidemia associated with type 2 diabetes mellitus (HCC) Again does not watch diet very closely and does very little exercise. Lab Results  Component Value Date   CHOL 206 (H) 01/12/2019   HDL 40 01/12/2019   LDLCALC 113 (H) 01/12/2019   TRIG 263 (H) 01/12/2019   CHOLHDL 5.2 (H) 01/12/2019      3. BMI 32.0-32.9,adult No recent weight changes    Outpatient Encounter Medications as of 02/09/2019  Medication Sig  . fenofibrate (TRICOR) 145 MG tablet TAKE 1 TABLET(145 MG) BY MOUTH DAILY  . glimepiride (AMARYL) 4 MG tablet TAKE 2 TABLETS BY MOUTH DAILY WITH BREAKFAST  . ketoconazole (NIZORAL) 200 MG tablet Take 1 tablet (200 mg total) by mouth daily.  Marland Kitchen. lisinopril (ZESTRIL) 10 MG tablet TAKE 1 TABLET(10 MG) BY MOUTH DAILY  . rosuvastatin (CRESTOR) 10 MG tablet Take 1 tablet (10 mg total) by mouth daily.  . sitaGLIPtin-metformin (JANUMET) 50-1000 MG tablet Take 1 tablet by mouth 2 (two) times daily with a meal.     Past Surgical History:  Procedure Laterality Date  . APPENDECTOMY      Family History  Problem Relation Age of Onset  . 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  HENT: Negative.   Eyes: Negative for pain.  Respiratory: Negative for shortness of breath.   Cardiovascular:  Negative for chest pain, palpitations and leg swelling.  Gastrointestinal: Negative for abdominal pain.  Endocrine: Negative for polydipsia.  Genitourinary: Negative.   Skin: Negative for rash.  Neurological: Negative for dizziness, weakness and headaches.  Hematological: Does not bruise/bleed easily.  Psychiatric/Behavioral: Negative.   All other systems reviewed and are negative.      Objective:   Physical Exam Vitals signs and nursing note reviewed.  Constitutional:      Appearance: Normal appearance. He is well-developed.  HENT:     Head: Normocephalic.     Nose: Nose normal.  Eyes:     Pupils: Pupils are equal, round, and reactive to light.  Neck:     Musculoskeletal: Normal range of motion and neck supple.     Thyroid: No thyroid mass or thyromegaly.     Vascular: No carotid bruit or JVD.     Trachea: Phonation normal.  Cardiovascular:     Rate and Rhythm: Normal rate and regular rhythm.  Pulmonary:     Effort: Pulmonary effort is normal. No respiratory distress.     Breath sounds: Normal breath sounds.  Abdominal:     General: Bowel sounds are normal.     Palpations: Abdomen is soft.     Tenderness: There is no abdominal tenderness.  Musculoskeletal: Normal range of motion.  Lymphadenopathy:     Cervical: No cervical adenopathy.  Skin:  General: Skin is warm and dry.  Neurological:     Mental Status: He is alert and oriented to person, place, and time.  Psychiatric:        Behavior: Behavior normal.        Thought Content: Thought content normal.        Judgment: Judgment normal.    BP 115/72   Pulse 85   Temp 98 F (36.7 C) (Oral)   Ht 5\' 11"  (1.803 m)   Wt 229 lb (103.9 kg)   BMI 31.94 kg/m    hgba1c 11.3     Assessment & Plan:  Steven Mcgee comes in today with chief complaint of Medical Management of Chronic Issues   Diagnosis and orders addressed:  1. Type 2 diabetes mellitus without complication, without long-term current use of  insulin (HCC) Added farxiga to januet and amaryl Refuses to check blood sugars at  home - Bayer DCA Hb A1c Waived - dapagliflozin propanediol (FARXIGA) 10 MG TABS tablet; Take 10 mg by mouth daily before breakfast.  Dispense: 30 tablet; Refill: 2  2. Hyperlipidemia associated with type 2 diabetes mellitus (HCC) Low fat diet  3. BMI 32.0-32.9,adult Discussed diet and exercise for person with BMI >25 Will recheck weight in 3-6 months    Labs pending Health Maintenance reviewed Diet and exercise encouraged  Follow up plan: 2 month   Kennard, FNP

## 2019-02-09 NOTE — Patient Instructions (Signed)
Carbohydrate Counting for Diabetes Mellitus, Adult  Carbohydrate counting is a method of keeping track of how many carbohydrates you eat. Eating carbohydrates naturally increases the amount of sugar (glucose) in the blood. Counting how many carbohydrates you eat helps keep your blood glucose within normal limits, which helps you manage your diabetes (diabetes mellitus). It is important to know how many carbohydrates you can safely have in each meal. This is different for every person. A diet and nutrition specialist (registered dietitian) can help you make a meal plan and calculate how many carbohydrates you should have at each meal and snack. Carbohydrates are found in the following foods:  Grains, such as breads and cereals.  Dried beans and soy products.  Starchy vegetables, such as potatoes, peas, and corn.  Fruit and fruit juices.  Milk and yogurt.  Sweets and snack foods, such as cake, cookies, candy, chips, and soft drinks. How do I count carbohydrates? There are two ways to count carbohydrates in food. You can use either of the methods or a combination of both. Reading "Nutrition Facts" on packaged food The "Nutrition Facts" list is included on the labels of almost all packaged foods and beverages in the [place] It includes:  The serving size.  Information about nutrients in each serving, including the grams (g) of carbohydrate per serving. To use the "Nutrition Facts":  Decide how many servings you will have.  Multiply the number of servings by the number of carbohydrates per serving.  The resulting number is the total amount of carbohydrates that you will be having. Learning standard serving sizes of other foods When you eat carbohydrate foods that are not packaged or do not include "Nutrition Facts" on the label, you need to measure the servings in order to count the amount of carbohydrates:  Measure the foods that you will eat with a food scale or measuring cup, if needed.   Decide how many standard-size servings you will eat.  Multiply the number of servings by 15. Most carbohydrate-rich foods have about 15 g of carbohydrates per serving. ? For example, if you eat 8 oz (170 g) of strawberries, you will have eaten 2 servings and 30 g of carbohydrates (2 servings x 15 g = 30 g).  For foods that have more than one food mixed, such as soups and casseroles, you must count the carbohydrates in each food that is included. The following list contains standard serving sizes of common carbohydrate-rich foods. Each of these servings has about 15 g of carbohydrates:   hamburger bun or  English muffin.   oz (15 mL) syrup.   oz (14 g) jelly.  1 slice of bread.  1 six-inch tortilla.  3 oz (85 g) cooked rice or pasta.  4 oz (113 g) cooked dried beans.  4 oz (113 g) starchy vegetable, such as peas, corn, or potatoes.  4 oz (113 g) hot cereal.  4 oz (113 g) mashed potatoes or  of a large baked potato.  4 oz (113 g) canned or frozen fruit.  4 oz (120 mL) fruit juice.  4-6 crackers.  6 chicken nuggets.  6 oz (170 g) unsweetened dry cereal.  6 oz (170 g) plain fat-free yogurt or yogurt sweetened with artificial sweeteners.  8 oz (240 mL) milk.  8 oz (170 g) fresh fruit or one small piece of fruit.  24 oz (680 g) popped popcorn. Example of carbohydrate counting Sample meal  3 oz (85 g) chicken breast.  6 oz (170 g)   brown rice.  4 oz (113 g) corn.  8 oz (240 mL) milk.  8 oz (170 g) strawberries with sugar-free whipped topping. Carbohydrate calculation 1. Identify the foods that contain carbohydrates: ? Rice. ? Corn. ? Milk. ? Strawberries. 2. Calculate how many servings you have of each food: ? 2 servings rice. ? 1 serving corn. ? 1 serving milk. ? 1 serving strawberries. 3. Multiply each number of servings by 15 g: ? 2 servings rice x 15 g = 30 g. ? 1 serving corn x 15 g = 15 g. ? 1 serving milk x 15 g = 15 g. ? 1 serving  strawberries x 15 g = 15 g. 4. Add together all of the amounts to find the total grams of carbohydrates eaten: ? 30 g + 15 g + 15 g + 15 g = 75 g of carbohydrates total. Summary  Carbohydrate counting is a method of keeping track of how many carbohydrates you eat.  Eating carbohydrates naturally increases the amount of sugar (glucose) in the blood.  Counting how many carbohydrates you eat helps keep your blood glucose within normal limits, which helps you manage your diabetes.  A diet and nutrition specialist (registered dietitian) can help you make a meal plan and calculate how many carbohydrates you should have at each meal and snack. This information is not intended to replace advice given to you by your health care provider. Make sure you discuss any questions you have with your health care provider. Document Released: 05/24/2005 Document Revised: 12/16/2016 Document Reviewed: 11/05/2015 Elsevier Patient Education  2020 [employer]  

## 2019-03-26 ENCOUNTER — Other Ambulatory Visit: Payer: Self-pay | Admitting: Nurse Practitioner

## 2019-03-26 DIAGNOSIS — E119 Type 2 diabetes mellitus without complications: Secondary | ICD-10-CM

## 2019-04-14 ENCOUNTER — Other Ambulatory Visit: Payer: Self-pay | Admitting: Nurse Practitioner

## 2019-04-14 DIAGNOSIS — Z13220 Encounter for screening for lipoid disorders: Secondary | ICD-10-CM

## 2019-04-19 ENCOUNTER — Other Ambulatory Visit: Payer: Self-pay

## 2019-04-20 ENCOUNTER — Ambulatory Visit (INDEPENDENT_AMBULATORY_CARE_PROVIDER_SITE_OTHER): Payer: BC Managed Care – PPO | Admitting: Nurse Practitioner

## 2019-04-20 ENCOUNTER — Encounter: Payer: Self-pay | Admitting: Nurse Practitioner

## 2019-04-20 VITALS — BP 104/74 | HR 82 | Temp 98.4°F | Ht 71.0 in | Wt 221.8 lb

## 2019-04-20 DIAGNOSIS — E785 Hyperlipidemia, unspecified: Secondary | ICD-10-CM | POA: Diagnosis not present

## 2019-04-20 DIAGNOSIS — E1169 Type 2 diabetes mellitus with other specified complication: Secondary | ICD-10-CM | POA: Diagnosis not present

## 2019-04-20 DIAGNOSIS — E119 Type 2 diabetes mellitus without complications: Secondary | ICD-10-CM

## 2019-04-20 DIAGNOSIS — Z6832 Body mass index (BMI) 32.0-32.9, adult: Secondary | ICD-10-CM

## 2019-04-20 LAB — BAYER DCA HB A1C WAIVED: HB A1C (BAYER DCA - WAIVED): 7.7 % — ABNORMAL HIGH (ref <7.0)

## 2019-04-20 NOTE — Progress Notes (Signed)
Subjective:    Patient ID: Steven Mcgee, male    DOB: [birth year], 49 y.o.   MRN: [id number]   Chief Complaint: Medical Management of Chronic Issues    HPI:  1. Type 2 diabetes mellitus without complication, without long-term current use of insulin ([place]) He does not watch his diet and does no exercise. He is a very noncompliant patient. He can get control of hid diabetes if he wants to. 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 He does not check his blood sugars at home. Lab Results  Component Value Date   HGBA1C 11.3 (H) 02/09/2019     2. Hyperlipidemia associated with type 2 diabetes mellitus ([place]) He does not watch diet and does not exercise. He is on tricor and crestor. Lab Results  Component Value Date   CHOL 206 (H) 01/12/2019   HDL 40 01/12/2019   LDLCALC 113 (H) 01/12/2019   TRIG 263 (H) 01/12/2019   CHOLHDL 5.2 (H) 01/12/2019     3. BMI 32.0-32.9,adult Weight is down 8lbs  Wt Readings from Last 3 Encounters:  02/09/19 229 lb (103.9 kg)  10/12/18 230 lb (104.3 kg)  05/01/18 234 lb (106.1 kg)   BMI Readings from Last 3 Encounters:  02/09/19 31.94 kg/m  10/12/18 32.08 kg/m  05/01/18 32.64 kg/m       Outpatient Encounter Medications as of 04/20/2019  Medication Sig  . dapagliflozin propanediol (FARXIGA) 10 MG TABS tablet Take 10 mg by mouth daily before breakfast.  . fenofibrate (TRICOR) 145 MG tablet TAKE 1 TABLET(145 MG) BY MOUTH DAILY  . glimepiride (AMARYL) 4 MG tablet TAKE 2 TABLETS BY MOUTH DAILY WITH BREAKFAST  . JANUMET 50-1000 MG tablet TAKE 1 TABLET BY MOUTH TWICE DAILY WITH A MEAL  . ketoconazole (NIZORAL) 200 MG tablet Take 1 tablet (200 mg total) by mouth daily.  Marland Kitchen lisinopril (ZESTRIL) 10 MG tablet TAKE 1 TABLET(10 MG) BY MOUTH DAILY  . rosuvastatin (CRESTOR) 10 MG tablet Take 1 tablet (10 mg total) by mouth daily.     Past Surgical History:  Procedure Laterality Date  . APPENDECTOMY       Family History  Problem Relation Age of Onset  . Cancer Mother     New complaints: None today  Social history: Lives by hisself  Controlled substance contract: n/a    Review of Systems  Constitutional: Negative for activity change and appetite change.  HENT: Negative.   Eyes: Negative for pain.  Respiratory: Negative for shortness of breath.   Cardiovascular: Negative for chest pain, palpitations and leg swelling.  Gastrointestinal: Negative for abdominal pain.  Endocrine: Negative for polydipsia.  Genitourinary: Negative.   Skin: Negative for rash.  Neurological: Negative for dizziness, weakness and headaches.  Hematological: Does not bruise/bleed easily.  Psychiatric/Behavioral: Negative.   All other systems reviewed and are negative.      Objective:   Physical Exam Vitals signs and nursing note reviewed.  Constitutional:      Appearance: Normal appearance. He is well-developed.  HENT:     Head: Normocephalic.     Nose: Nose normal.  Eyes:     Pupils: Pupils are equal, round, and reactive to light.  Neck:     Musculoskeletal: Normal range of motion and neck supple.     Thyroid: No thyroid mass or thyromegaly.     Vascular: No carotid bruit or JVD.     Trachea: Phonation normal.  Cardiovascular:  Rate and Rhythm: Normal rate and regular rhythm.  Pulmonary:     Effort: Pulmonary effort is normal. No respiratory distress.     Breath sounds: Normal breath sounds.  Abdominal:     General: Bowel sounds are normal.     Palpations: Abdomen is soft.     Tenderness: There is no abdominal tenderness.  Musculoskeletal: Normal range of motion.  Lymphadenopathy:     Cervical: No cervical adenopathy.  Skin:    General: Skin is warm and dry.  Neurological:     Mental Status: He is alert and oriented to person, place, and time.  Psychiatric:        Behavior: Behavior normal.        Thought Content: Thought content normal.        Judgment: Judgment normal.      BP 104/74   Pulse 82   Temp 98.4 F (36.9 C) (Temporal)   Ht 5' 11"  (1.803 m)   Wt 221 lb 12.8 oz (100.6 kg)   SpO2 97%   BMI 30.93 kg/m   hgba1c 7.7      Assessment & Plan:  Steven Mcgee comes in today with chief complaint of Medical Management of Chronic Issues and Diabetes   Diagnosis and orders addressed:  1. Type 2 diabetes mellitus without complication, without long-term current use of insulin (HCC) Continue to wtach carbs in diet - CMP14+EGFR - Lipid panel - hgba1c  2. Hyperlipidemia associated with type 2 diabetes mellitus ([facility]CC) Low fat diet - CMP14+EGFR - Lipid panel - hgba1c  3. BMI 32.0-32.9,adult Discussed diet and exercise for person with BMI >25 Will recheck weight in 3-6 months   Labs pending Health Maintenance reviewed Diet and exercise encouraged  Follow up plan: 3 months   [name] Hassell Done, FNP

## 2019-04-20 NOTE — Patient Instructions (Signed)
Diabetes Mellitus and Foot Care Foot care is an important part of your health, especially when you have diabetes. Diabetes may cause you to have problems because of poor blood flow (circulation) to your feet and legs, which can cause your skin to:  Become thinner and drier.  Break more easily.  Heal more slowly.  Peel and crack. You may also have nerve damage (neuropathy) in your legs and feet, causing decreased feeling in them. This means that you may not notice minor injuries to your feet that could lead to more serious problems. Noticing and addressing any potential problems early is the best way to prevent future foot problems. How to care for your feet Foot hygiene  Wash your feet daily with warm water and mild soap. Do not use hot water. Then, pat your feet and the areas between your toes until they are completely dry. Do not soak your feet as this can dry your skin.  Trim your toenails straight across. Do not dig under them or around the cuticle. File the edges of your nails with an emery board or nail file.  Apply a moisturizing lotion or petroleum jelly to the skin on your feet and to dry, brittle toenails. Use lotion that does not contain alcohol and is unscented. Do not apply lotion between your toes. Shoes and socks  Wear clean socks or stockings every day. Make sure they are not too tight. Do not wear knee-high stockings since they may decrease blood flow to your legs.  Wear shoes that fit properly and have enough cushioning. Always look in your shoes before you put them on to be sure there are no objects inside.  To break in new shoes, wear them for just a few hours a day. This prevents injuries on your feet. Wounds, scrapes, corns, and calluses  Check your feet daily for blisters, cuts, bruises, sores, and redness. If you cannot see the bottom of your feet, use a mirror or ask someone for help.  Do not cut corns or calluses or try to remove them with medicine.  If you  find a minor scrape, cut, or break in the skin on your feet, keep it and the skin around it clean and dry. You may clean these areas with mild soap and water. Do not clean the area with peroxide, alcohol, or iodine.  If you have a wound, scrape, corn, or callus on your foot, look at it several times a day to make sure it is healing and not infected. Check for: ? Redness, swelling, or pain. ? Fluid or blood. ? Warmth. ? Pus or a bad smell. General instructions  Do not cross your legs. This may decrease blood flow to your feet.  Do not use heating pads or hot water bottles on your feet. They may burn your skin. If you have lost feeling in your feet or legs, you may not know this is happening until it is too late.  Protect your feet from hot and cold by wearing shoes, such as at the beach or on hot pavement.  Schedule a complete foot exam at least once a year (annually) or more often if you have foot problems. If you have foot problems, report any cuts, sores, or bruises to your health care provider immediately. Contact a health care provider if:  You have a medical condition that increases your risk of infection and you have any cuts, sores, or bruises on your feet.  You have an injury that is not   healing.  You have redness on your legs or feet.  You feel burning or tingling in your legs or feet.  You have pain or cramps in your legs and feet.  Your legs or feet are numb.  Your feet always feel cold.  You have pain around a toenail. Get help right away if:  You have a wound, scrape, corn, or callus on your foot and: ? You have pain, swelling, or redness that gets worse. ? You have fluid or blood coming from the wound, scrape, corn, or callus. ? Your wound, scrape, corn, or callus feels warm to the touch. ? You have pus or a bad smell coming from the wound, scrape, corn, or callus. ? You have a fever. ? You have a red line going up your leg. Summary  Check your feet every day  for cuts, sores, red spots, swelling, and blisters.  Moisturize feet and legs daily.  Wear shoes that fit properly and have enough cushioning.  If you have foot problems, report any cuts, sores, or bruises to your health care provider immediately.  Schedule a complete foot exam at least once a year (annually) or more often if you have foot problems. This information is not intended to replace advice given to you by your health care provider. Make sure you discuss any questions you have with your health care provider. Document Released: 05/21/2000 Document Revised: 07/06/2017 Document Reviewed: 06/25/2016 Elsevier Patient Education  2020 [employer] Inc[employer]  

## 2019-04-21 LAB — CMP14+EGFR
ALT: 36 IU/L (ref 0–44)
AST: 24 IU/L (ref 0–40)
Albumin/Globulin Ratio: 2 (ref 1.2–2.2)
Albumin: 5.1 g/dL — ABNORMAL HIGH (ref 4.0–5.0)
Alkaline Phosphatase: 45 IU/L (ref 39–117)
BUN/Creatinine Ratio: 15 (ref 9–20)
BUN: 19 mg/dL (ref 6–24)
Bilirubin Total: 0.4 mg/dL (ref 0.0–1.2)
CO2: 22 mmol/L (ref 20–29)
Calcium: 9.7 mg/dL (ref 8.7–10.2)
Chloride: 102 mmol/L (ref 96–106)
Creatinine, Ser: 1.28 mg/dL — ABNORMAL HIGH (ref 0.76–1.27)
GFR calc Af Amer: 75 mL/min/{1.73_m2} (ref >59)
GFR calc non Af Amer: 65 mL/min/{1.73_m2} (ref >59)
Globulin, Total: 2.5 g/dL (ref 1.5–4.5)
Glucose: 113 mg/dL — ABNORMAL HIGH (ref 65–99)
Potassium: 4.8 mmol/L (ref 3.5–5.2)
Sodium: 140 mmol/L (ref 134–144)
Total Protein: 7.6 g/dL (ref 6.0–8.5)

## 2019-04-21 LAB — LIPID PANEL
Chol/HDL Ratio: 3.5 ratio (ref 0.0–5.0)
Cholesterol, Total: 128 mg/dL (ref 100–199)
HDL: 37 mg/dL — ABNORMAL LOW (ref >39)
LDL Chol Calc (NIH): 55 mg/dL (ref 0–99)
Triglycerides: 227 mg/dL — ABNORMAL HIGH (ref 0–149)
VLDL Cholesterol Cal: 36 mg/dL (ref 5–40)

## 2019-04-24 ENCOUNTER — Other Ambulatory Visit: Payer: Self-pay | Admitting: Nurse Practitioner

## 2019-04-24 DIAGNOSIS — R809 Proteinuria, unspecified: Secondary | ICD-10-CM

## 2019-04-25 ENCOUNTER — Other Ambulatory Visit: Payer: Self-pay

## 2019-04-26 ENCOUNTER — Encounter: Payer: Self-pay | Admitting: Nurse Practitioner

## 2019-04-26 ENCOUNTER — Ambulatory Visit (INDEPENDENT_AMBULATORY_CARE_PROVIDER_SITE_OTHER): Payer: BC Managed Care – PPO | Admitting: Nurse Practitioner

## 2019-04-26 DIAGNOSIS — Z024 Encounter for examination for driving license: Secondary | ICD-10-CM

## 2019-04-26 LAB — URINALYSIS
Bilirubin, UA: NEGATIVE
Ketones, UA: NEGATIVE
Leukocytes,UA: NEGATIVE
Nitrite, UA: NEGATIVE
Protein,UA: NEGATIVE
RBC, UA: NEGATIVE
Specific Gravity, UA: 1.02 (ref 1.005–1.030)
Urobilinogen, Ur: 0.2 mg/dL (ref 0.2–1.0)
pH, UA: 5 (ref 5.0–7.5)

## 2019-04-26 NOTE — Progress Notes (Signed)
Patient ID: Steven Mcgee, male   DOB: 1971/03/28, 48 y.o.   MRN: [id number]   Private DOTphysical - see scanned in document

## 2019-05-04 ENCOUNTER — Other Ambulatory Visit: Payer: Self-pay | Admitting: Nurse Practitioner

## 2019-05-04 DIAGNOSIS — E119 Type 2 diabetes mellitus without complications: Secondary | ICD-10-CM

## 2019-07-03 ENCOUNTER — Telehealth: Payer: Self-pay | Admitting: Nurse Practitioner

## 2019-07-03 DIAGNOSIS — Z13220 Encounter for screening for lipoid disorders: Secondary | ICD-10-CM

## 2019-07-03 DIAGNOSIS — R809 Proteinuria, unspecified: Secondary | ICD-10-CM

## 2019-07-03 DIAGNOSIS — E119 Type 2 diabetes mellitus without complications: Secondary | ICD-10-CM

## 2019-07-03 MED ORDER — FARXIGA 10 MG PO TABS: ORAL_TABLET | ORAL | 2 refills | Status: DC

## 2019-07-03 MED ORDER — JANUMET 50-1000 MG PO TABS: ORAL_TABLET | ORAL | 0 refills | Status: DC

## 2019-07-03 MED ORDER — FENOFIBRATE 145 MG PO TABS: ORAL_TABLET | ORAL | 0 refills | Status: DC

## 2019-07-03 MED ORDER — LISINOPRIL 10 MG PO TABS: ORAL_TABLET | ORAL | 0 refills | Status: DC

## 2019-07-03 MED ORDER — ROSUVASTATIN CALCIUM 10 MG PO TABS: 10.0000 mg | ORAL_TABLET | Freq: Every day | ORAL | 0 refills | Status: DC

## 2019-07-03 MED ORDER — GLIMEPIRIDE 4 MG PO TABS: ORAL_TABLET | ORAL | 0 refills | Status: DC

## 2019-07-03 NOTE — Telephone Encounter (Signed)
Pt called requesting that [facility] send all of his Rx's to [facility] in [place]. Pt says that's where he will pick up all of his Rx's now that [facility] no longer takes his insurance.

## 2019-07-03 NOTE — Telephone Encounter (Signed)
Sent 1 3 month RF for all meds to [facility] [place] per patients request

## 2019-07-04 ENCOUNTER — Other Ambulatory Visit: Payer: Self-pay | Admitting: Nurse Practitioner

## 2019-07-04 DIAGNOSIS — E119 Type 2 diabetes mellitus without complications: Secondary | ICD-10-CM

## 2019-07-15 ENCOUNTER — Other Ambulatory Visit: Payer: Self-pay | Admitting: Nurse Practitioner

## 2019-07-15 DIAGNOSIS — E119 Type 2 diabetes mellitus without complications: Secondary | ICD-10-CM

## 2019-07-20 ENCOUNTER — Other Ambulatory Visit: Payer: Self-pay | Admitting: Nurse Practitioner

## 2019-07-20 DIAGNOSIS — R809 Proteinuria, unspecified: Secondary | ICD-10-CM

## 2019-07-30 ENCOUNTER — Other Ambulatory Visit: Payer: Self-pay

## 2019-07-31 ENCOUNTER — Ambulatory Visit: Payer: BC Managed Care – PPO | Admitting: Nurse Practitioner

## 2019-07-31 ENCOUNTER — Encounter: Payer: Self-pay | Admitting: Nurse Practitioner

## 2019-07-31 VITALS — BP 106/65 | HR 83 | Temp 98.0°F | Resp 20 | Ht 71.0 in | Wt 226.0 lb

## 2019-07-31 DIAGNOSIS — R809 Proteinuria, unspecified: Secondary | ICD-10-CM | POA: Diagnosis not present

## 2019-07-31 DIAGNOSIS — E119 Type 2 diabetes mellitus without complications: Secondary | ICD-10-CM | POA: Diagnosis not present

## 2019-07-31 DIAGNOSIS — E1169 Type 2 diabetes mellitus with other specified complication: Secondary | ICD-10-CM

## 2019-07-31 DIAGNOSIS — E785 Hyperlipidemia, unspecified: Secondary | ICD-10-CM

## 2019-07-31 DIAGNOSIS — Z13220 Encounter for screening for lipoid disorders: Secondary | ICD-10-CM

## 2019-07-31 DIAGNOSIS — Z6832 Body mass index (BMI) 32.0-32.9, adult: Secondary | ICD-10-CM

## 2019-07-31 LAB — BAYER DCA HB A1C WAIVED: HB A1C (BAYER DCA - WAIVED): 9.2 % — ABNORMAL HIGH (ref <7.0)

## 2019-07-31 MED ORDER — FARXIGA 10 MG PO TABS: ORAL_TABLET | ORAL | 1 refills | Status: DC

## 2019-07-31 MED ORDER — LISINOPRIL 10 MG PO TABS: ORAL_TABLET | ORAL | 1 refills | Status: DC

## 2019-07-31 MED ORDER — FENOFIBRATE 145 MG PO TABS: ORAL_TABLET | ORAL | 1 refills | Status: DC

## 2019-07-31 MED ORDER — ROSUVASTATIN CALCIUM 10 MG PO TABS: 10.0000 mg | ORAL_TABLET | Freq: Every day | ORAL | 1 refills | Status: DC

## 2019-07-31 MED ORDER — JANUMET 50-1000 MG PO TABS: ORAL_TABLET | ORAL | 1 refills | Status: DC

## 2019-07-31 MED ORDER — GLIMEPIRIDE 4 MG PO TABS: 4.0000 mg | ORAL_TABLET | Freq: Two times a day (BID) | ORAL | 1 refills | Status: DC

## 2019-07-31 NOTE — Progress Notes (Signed)
Subjective:    Patient ID: Steven Mcgee, male    DOB: 07-17-1970, 49 y.o.   MRN: [id number]   Chief Complaint: Medical Management of Chronic Issues    HPI:  1. Type 2 diabetes mellitus without complication, without long-term current use of insulin ([place]) He does not check his blood sugars at home. He doe snot really watch his diet either. He denies any symptoms of low blood sugars. Lab Results  Component Value Date   HGBA1C 7.7 (H) 04/20/2019     2. Hyperlipidemia associated with type 2 diabetes mellitus ([place]) Does not watch diet and does very little exercise. Lab Results  Component Value Date   CHOL 128 04/20/2019   HDL 37 (L) 04/20/2019   LDLCALC 55 04/20/2019   TRIG 227 (H) 04/20/2019   CHOLHDL 3.5 04/20/2019     3. BMI 32.0-32.9,adult No recent weight changes. Wt Readings from Last 3 Encounters:  07/31/19 226 lb (102.5 kg)  04/20/19 221 lb 12.8 oz (100.6 kg)  02/09/19 229 lb (103.9 kg)   BMI Readings from Last 3 Encounters:  07/31/19 31.52 kg/m  04/20/19 30.93 kg/m  02/09/19 31.94 kg/m       Outpatient Encounter Medications as of 07/31/2019  Medication Sig  . dapagliflozin propanediol (FARXIGA) 10 MG TABS tablet TAKE 1 TABLET BY MOUTH DAILY BEFORE BREAKFAST  . fenofibrate (TRICOR) 145 MG tablet TAKE 1 TABLET(145 MG) BY MOUTH DAILY  . glimepiride (AMARYL) 4 MG tablet TAKE 2 TABLETS BY MOUTH DAILY WITH BREAKFAST  . lisinopril (ZESTRIL) 10 MG tablet TAKE 1 TABLET(10 MG) BY MOUTH DAILY  . rosuvastatin (CRESTOR) 10 MG tablet Take 1 tablet (10 mg total) by mouth daily.  . sitaGLIPtin-metformin (JANUMET) 50-1000 MG tablet TAKE 1 TABLET BY MOUTH TWICE DAILY WITH A MEAL    Past Surgical History:  Procedure Laterality Date  . APPENDECTOMY      Family History  Problem Relation Age of Onset  . Cancer Mother     New complaints: None today  Social history: Lives with his girlfriend.   Controlled substance contract: n/a    Review of Systems   Constitutional: Negative for diaphoresis.  Eyes: Negative for pain.  Respiratory: Negative for shortness of breath.   Cardiovascular: Negative for chest pain, palpitations and leg swelling.  Gastrointestinal: Negative for abdominal pain.  Endocrine: Negative for polydipsia.  Skin: Negative for rash.  Neurological: Negative for dizziness, weakness and headaches.  Hematological: Does not bruise/bleed easily.  All other systems reviewed and are negative.      Objective:   Physical Exam Vitals and nursing note reviewed.  Constitutional:      Appearance: Normal appearance. He is well-developed.  HENT:     Head: Normocephalic.     Nose: Nose normal.  Eyes:     Pupils: Pupils are equal, round, and reactive to light.  Neck:     Thyroid: No thyroid mass or thyromegaly.     Vascular: No carotid bruit or JVD.     Trachea: Phonation normal.  Cardiovascular:     Rate and Rhythm: Normal rate and regular rhythm.  Pulmonary:     Effort: Pulmonary effort is normal. No respiratory distress.     Breath sounds: Normal breath sounds.  Abdominal:     General: Bowel sounds are normal.     Palpations: Abdomen is soft.     Tenderness: There is no abdominal tenderness.  Musculoskeletal:        General: Normal range of motion.  Cervical back: Normal range of motion and neck supple.  Lymphadenopathy:     Cervical: No cervical adenopathy.  Skin:    General: Skin is warm and dry.  Neurological:     Mental Status: He is alert and oriented to person, place, and time.  Psychiatric:        Behavior: Behavior normal.        Thought Content: Thought content normal.        Judgment: Judgment normal.    BP 106/65   Pulse 83   Temp 98 F (36.7 C)   Resp 20   Ht _0  (1.803 m)   Wt 226 lb (102.5 kg)   SpO2 96%   BMI 31.52 kg/m    hgba1c 9.2    Assessment & Plan:  Joevanni Roddey comes in today with chief complaint of Medical Management of Chronic Issues   Diagnosis and orders  addressed:  1. Type 2 diabetes mellitus without complication, without long-term current use of insulin ([facility]) Stricter carb counting Patient refuses medication changes- says he can get down with diet - CBC with Differential/Platelet - CMP14+EGFR - Bayer DCA Hb A1c Waived - glimepiride (AMARYL) 4 MG tablet; Take 1 tablet (4 mg total) by mouth in the morning and at bedtime.  Dispense: 180 tablet; Refill: 1 - sitaGLIPtin-metformin (JANUMET) 50-1000 MG tablet; TAKE 1 TABLET BY MOUTH TWICE DAILY WITH A MEAL  Dispense: 180 tablet; Refill: 1 - dapagliflozin propanediol (FARXIGA) 10 MG TABS tablet; TAKE 1 TABLET BY MOUTH DAILY BEFORE BREAKFAST  Dispense: 90 tablet; Refill: 1  2. Hyperlipidemia associated with type 2 diabetes mellitus ([facility]CC) Low fat diet - CBC with Differential/Platelet - Lipid panel - fenofibrate (TRICOR) 145 MG tablet; TAKE 1 TABLET(145 MG) BY MOUTH DAILY  Dispense: 90 tablet; Refill: 1   3. BMI 32.0-32.9,adult Discussed diet and exercise for person with BMI >25 Will recheck weight in 3-6 months  4. Microalbuminuria - lisinopril (ZESTRIL) 10 MG tablet; TAKE 1 TABLET(10 MG) BY MOUTH DAILY  Dispense: 90 tablet; Refill: 1    Labs pending Health Maintenance reviewed Diet and exercise encouraged  Follow up plan: 3 months   [name] Hassell Done, FNP

## 2019-08-03 LAB — CMP14+EGFR
ALT: 32 IU/L (ref 0–44)
AST: 24 IU/L (ref 0–40)
Albumin/Globulin Ratio: 1.8 (ref 1.2–2.2)
Albumin: 4.8 g/dL (ref 4.0–5.0)
Alkaline Phosphatase: 44 IU/L (ref 39–117)
BUN/Creatinine Ratio: 11 (ref 9–20)
BUN: 16 mg/dL (ref 6–24)
Bilirubin Total: 0.4 mg/dL (ref 0.0–1.2)
CO2: 21 mmol/L (ref 20–29)
Calcium: 9.8 mg/dL (ref 8.7–10.2)
Chloride: 103 mmol/L (ref 96–106)
Creatinine, Ser: 1.41 mg/dL — ABNORMAL HIGH (ref 0.76–1.27)
GFR calc Af Amer: 67 mL/min/{1.73_m2} (ref >59)
GFR calc non Af Amer: 58 mL/min/{1.73_m2} — ABNORMAL LOW (ref >59)
Globulin, Total: 2.7 g/dL (ref 1.5–4.5)
Glucose: 256 mg/dL — ABNORMAL HIGH (ref 65–99)
Potassium: 4.5 mmol/L (ref 3.5–5.2)
Sodium: 138 mmol/L (ref 134–144)
Total Protein: 7.5 g/dL (ref 6.0–8.5)

## 2019-08-03 LAB — LIPID PANEL
Chol/HDL Ratio: 3.3 ratio (ref 0.0–5.0)
Cholesterol, Total: 125 mg/dL (ref 100–199)
HDL: 38 mg/dL — ABNORMAL LOW (ref >39)
LDL Chol Calc (NIH): 47 mg/dL (ref 0–99)
Triglycerides: 254 mg/dL — ABNORMAL HIGH (ref 0–149)
VLDL Cholesterol Cal: 40 mg/dL (ref 5–40)

## 2019-08-03 LAB — CBC WITH DIFFERENTIAL/PLATELET
Basophils Absolute: 0 10*3/uL (ref 0.0–0.2)
Basos: 1 %
EOS (ABSOLUTE): 0.1 10*3/uL (ref 0.0–0.4)
Eos: 1 %
Hematocrit: 49.6 % (ref 37.5–51.0)
Hemoglobin: 16.7 g/dL (ref 13.0–17.7)
Immature Grans (Abs): 0 10*3/uL (ref 0.0–0.1)
Immature Granulocytes: 0 %
Lymphocytes Absolute: 2.7 10*3/uL (ref 0.7–3.1)
Lymphs: 41 %
MCH: 29.8 pg (ref 26.6–33.0)
MCHC: 33.7 g/dL (ref 31.5–35.7)
MCV: 89 fL (ref 79–97)
Monocytes Absolute: 0.5 10*3/uL (ref 0.1–0.9)
Monocytes: 8 %
Neutrophils Absolute: 3.3 10*3/uL (ref 1.4–7.0)
Neutrophils: 49 %
Platelets: 270 10*3/uL (ref 150–450)
RBC: 5.6 x10E6/uL (ref 4.14–5.80)
RDW: 13 % (ref 11.6–15.4)
WBC: 6.6 10*3/uL (ref 3.4–10.8)

## 2019-08-27 ENCOUNTER — Telehealth: Payer: Self-pay | Admitting: *Deleted

## 2019-08-27 NOTE — Telephone Encounter (Signed)
Tried to do Prior Auth for Janumet 50-1000MG  tablets on CoverMyMeds but error message:Authorization already on file for this request.  Will close encounter

## 2019-10-27 DIAGNOSIS — J324 Chronic pansinusitis: Secondary | ICD-10-CM | POA: Diagnosis not present

## 2019-10-27 DIAGNOSIS — J320 Chronic maxillary sinusitis: Secondary | ICD-10-CM | POA: Diagnosis not present

## 2019-10-27 DIAGNOSIS — J0140 Acute pansinusitis, unspecified: Secondary | ICD-10-CM | POA: Diagnosis not present

## 2019-10-27 DIAGNOSIS — E11638 Type 2 diabetes mellitus with other oral complications: Secondary | ICD-10-CM | POA: Diagnosis not present

## 2019-10-27 DIAGNOSIS — Z79899 Other long term (current) drug therapy: Secondary | ICD-10-CM | POA: Diagnosis not present

## 2019-10-27 DIAGNOSIS — R6883 Chills (without fever): Secondary | ICD-10-CM | POA: Diagnosis not present

## 2019-10-27 DIAGNOSIS — J3489 Other specified disorders of nose and nasal sinuses: Secondary | ICD-10-CM | POA: Diagnosis not present

## 2019-10-27 DIAGNOSIS — J329 Chronic sinusitis, unspecified: Secondary | ICD-10-CM | POA: Diagnosis not present

## 2019-10-27 DIAGNOSIS — Z7984 Long term (current) use of oral hypoglycemic drugs: Secondary | ICD-10-CM | POA: Diagnosis not present

## 2019-11-02 ENCOUNTER — Other Ambulatory Visit: Payer: Self-pay

## 2019-11-02 ENCOUNTER — Ambulatory Visit: Payer: BC Managed Care – PPO | Admitting: Nurse Practitioner

## 2019-11-02 ENCOUNTER — Encounter: Payer: Self-pay | Admitting: Nurse Practitioner

## 2019-11-02 VITALS — BP 126/85 | HR 88 | Temp 98.2°F | Ht 71.0 in | Wt 220.0 lb

## 2019-11-02 DIAGNOSIS — E119 Type 2 diabetes mellitus without complications: Secondary | ICD-10-CM

## 2019-11-02 DIAGNOSIS — E1169 Type 2 diabetes mellitus with other specified complication: Secondary | ICD-10-CM | POA: Diagnosis not present

## 2019-11-02 DIAGNOSIS — E785 Hyperlipidemia, unspecified: Secondary | ICD-10-CM

## 2019-11-02 DIAGNOSIS — R809 Proteinuria, unspecified: Secondary | ICD-10-CM

## 2019-11-02 DIAGNOSIS — Z6832 Body mass index (BMI) 32.0-32.9, adult: Secondary | ICD-10-CM

## 2019-11-02 LAB — BAYER DCA HB A1C WAIVED: HB A1C (BAYER DCA - WAIVED): 8.1 % — ABNORMAL HIGH (ref <7.0)

## 2019-11-02 MED ORDER — GLIMEPIRIDE 4 MG PO TABS: 4.0000 mg | ORAL_TABLET | Freq: Two times a day (BID) | ORAL | 1 refills | Status: DC

## 2019-11-02 MED ORDER — DAPAGLIFLOZIN PROPANEDIOL 10 MG PO TABS: ORAL_TABLET | ORAL | 1 refills | Status: DC

## 2019-11-02 MED ORDER — ROSUVASTATIN CALCIUM 10 MG PO TABS: 10.0000 mg | ORAL_TABLET | Freq: Every day | ORAL | 1 refills | Status: DC

## 2019-11-02 MED ORDER — JANUMET 50-1000 MG PO TABS: ORAL_TABLET | ORAL | 1 refills | Status: DC

## 2019-11-02 MED ORDER — LISINOPRIL 10 MG PO TABS: ORAL_TABLET | ORAL | 1 refills | Status: DC

## 2019-11-02 NOTE — Progress Notes (Signed)
Subjective:    Patient ID: Steven Mcgee, male    DOB: 26-Apr-1971, 49 y.o.   MRN: [id number]   Chief Complaint: medical management of chronic issues      HPI:  1. Type 2 diabetes mellitus without complication, without long-term current use of insulin ([facility]) He does not check blood sugar very often. He denies any symptom of low blood sugar. No change were made to meds at last viist. Lab Results  Component Value Date   HGBA1C 9.2 (H) 07/31/2019   . 2. Hyperlipidemia associated with type 2 diabetes mellitus ([place]) Does not watch diet and does very little exercise  3. BMI 32.0-32.9,adult No recent weight changes Wt Readings from Last 3 Encounters:  11/02/19 220 lb (99.8 kg)  07/31/19 226 lb (102.5 kg)  04/20/19 221 lb 12.8 oz (100.6 kg)   BMI Readings from Last 3 Encounters:  11/02/19 30.68 kg/m  07/31/19 31.52 kg/m  04/20/19 30.93 kg/m       Outpatient Encounter Medications as of 11/02/2019  Medication Sig  . dapagliflozin propanediol (FARXIGA) 10 MG TABS tablet TAKE 1 TABLET BY MOUTH DAILY BEFORE BREAKFAST  . fenofibrate (TRICOR) 145 MG tablet TAKE 1 TABLET(145 MG) BY MOUTH DAILY  . glimepiride (AMARYL) 4 MG tablet Take 1 tablet (4 mg total) by mouth in the morning and at bedtime.  Marland Kitchen lisinopril (ZESTRIL) 10 MG tablet TAKE 1 TABLET(10 MG) BY MOUTH DAILY  . rosuvastatin (CRESTOR) 10 MG tablet Take 1 tablet (10 mg total) by mouth daily.  . sitaGLIPtin-metformin (JANUMET) 50-1000 MG tablet TAKE 1 TABLET BY MOUTH TWICE DAILY WITH A MEAL     Past Surgical History:  Procedure Laterality Date  . APPENDECTOMY      Family History  Problem Relation Age of Onset  . Cancer Mother     New complaints: None today  Social history: lives by hisself  Controlled substance contract: n/a     Review of Systems  Constitutional: Negative for diaphoresis.  Eyes: Negative for pain.  Respiratory: Negative for shortness of breath.   Cardiovascular: Negative for chest  pain, palpitations and leg swelling.  Gastrointestinal: Negative for abdominal pain.  Endocrine: Negative for polydipsia.  Skin: Negative for rash.  Neurological: Negative for dizziness, weakness and headaches.  Hematological: Does not bruise/bleed easily.  All other systems reviewed and are negative.      Objective:   Physical Exam Vitals and nursing note reviewed.  Constitutional:      Appearance: Normal appearance. He is well-developed.  HENT:     Head: Normocephalic.     Nose: Nose normal.  Eyes:     Pupils: Pupils are equal, round, and reactive to light.  Neck:     Thyroid: No thyroid mass or thyromegaly.     Vascular: No carotid bruit or JVD.     Trachea: Phonation normal.  Cardiovascular:     Rate and Rhythm: Normal rate and regular rhythm.  Pulmonary:     Effort: Pulmonary effort is normal. No respiratory distress.     Breath sounds: Normal breath sounds.  Abdominal:     General: Bowel sounds are normal.     Palpations: Abdomen is soft.     Tenderness: There is no abdominal tenderness.  Musculoskeletal:        General: Normal range of motion.     Cervical back: Normal range of motion and neck supple.  Lymphadenopathy:     Cervical: No cervical adenopathy.  Skin:    General: Skin is  warm and dry.  Neurological:     Mental Status: He is alert and oriented to person, place, and time.  Psychiatric:        Behavior: Behavior normal.        Thought Content: Thought content normal.        Judgment: Judgment normal.    BP 126/85   Pulse 88   Temp 98.2 F (36.8 C)   Ht 5\' 11"  (1.803 m)   Wt 220 lb (99.8 kg)   SpO2 96%   BMI 30.68 kg/m        Assessment & Plan:  in today with chief complaint of Medical Management of Chronic Issues, Diabetes, and Hyperlipidemia   1. Type 2 diabetes mellitus without complication, without long-term current use of insulin ([facility]) Stricter carb counting - Bayer DCA Hb A1c Waived - glimepiride (AMARYL) 4 MG  tablet; Take 1 tablet (4 mg total) by mouth in the morning and at bedtime.  Dispense: 180 tablet; Refill: 1 - sitaGLIPtin-metformin (JANUMET) 50-1000 MG tablet; TAKE 1 TABLET BY MOUTH TWICE DAILY WITH A MEAL  Dispense: 180 tablet; Refill: 1 - dapagliflozin propanediol (FARXIGA) 10 MG TABS tablet; TAKE 1 TABLET BY MOUTH DAILY BEFORE BREAKFAST  Dispense: 90 tablet; Refill: 1  2. Hyperlipidemia associated with type 2 diabetes mellitus ([facility]CC) Low fat diet  3. BMI 32.0-32.9,adult Discussed diet and exercise for person with BMI >25 Will recheck weight in 3-6 months  4. Microalbuminuria - lisinopril (ZESTRIL) 10 MG tablet; TAKE 1 TABLET(10 MG) BY MOUTH DAILY  Dispense: 90 tablet; Refill: 1    Lab pending Follow up n 3 month   Steven Mcgee 10-27-1994, FNP

## 2019-11-02 NOTE — Patient Instructions (Signed)
Carbohydrate Counting for Diabetes Mellitus, Adult  Carbohydrate counting is a method of keeping track of how many carbohydrates you eat. Eating carbohydrates naturally increases the amount of sugar (glucose) in the blood. Counting how many carbohydrates you eat helps keep your blood glucose within normal limits, which helps you manage your diabetes (diabetes mellitus). It is important to know how many carbohydrates you can safely have in each meal. This is different for every person. A diet and nutrition specialist (registered dietitian) can help you make a meal plan and calculate how many carbohydrates you should have at each meal and snack. Carbohydrates are found in the following foods:  Grains, such as breads and cereals.  Dried beans and soy products.  Starchy vegetables, such as potatoes, peas, and corn.  Fruit and fruit juices.  Milk and yogurt.  Sweets and snack foods, such as cake, cookies, candy, chips, and soft drinks. How do I count carbohydrates? There are two ways to count carbohydrates in food. You can use either of the methods or a combination of both. Reading "Nutrition Facts" on packaged food The "Nutrition Facts" list is included on the labels of almost all packaged foods and beverages in the [place] It includes:  The serving size.  Information about nutrients in each serving, including the grams (g) of carbohydrate per serving. To use the "Nutrition Facts":  Decide how many servings you will have.  Multiply the number of servings by the number of carbohydrates per serving.  The resulting number is the total amount of carbohydrates that you will be having. Learning standard serving sizes of other foods When you eat carbohydrate foods that are not packaged or do not include "Nutrition Facts" on the label, you need to measure the servings in order to count the amount of carbohydrates:  Measure the foods that you will eat with a food scale or measuring cup, if  needed.  Decide how many standard-size servings you will eat.  Multiply the number of servings by 15. Most carbohydrate-rich foods have about 15 g of carbohydrates per serving. ? For example, if you eat 8 oz (170 g) of strawberries, you will have eaten 2 servings and 30 g of carbohydrates (2 servings x 15 g = 30 g).  For foods that have more than one food mixed, such as soups and casseroles, you must count the carbohydrates in each food that is included. The following list contains standard serving sizes of common carbohydrate-rich foods. Each of these servings has about 15 g of carbohydrates:   hamburger bun or  English muffin.   oz (15 mL) syrup.   oz (14 g) jelly.  1 slice of bread.  1 six-inch tortilla.  3 oz (85 g) cooked rice or pasta.  4 oz (113 g) cooked dried beans.  4 oz (113 g) starchy vegetable, such as peas, corn, or potatoes.  4 oz (113 g) hot cereal.  4 oz (113 g) mashed potatoes or  of a large baked potato.  4 oz (113 g) canned or frozen fruit.  4 oz (120 mL) fruit juice.  4-6 crackers.  6 chicken nuggets.  6 oz (170 g) unsweetened dry cereal.  6 oz (170 g) plain fat-free yogurt or yogurt sweetened with artificial sweeteners.  8 oz (240 mL) milk.  8 oz (170 g) fresh fruit or one small piece of fruit.  24 oz (680 g) popped popcorn. Example of carbohydrate counting Sample meal  3 oz (85 g) chicken breast.  6 oz (170 g)   brown rice.  4 oz (113 g) corn.  8 oz (240 mL) milk.  8 oz (170 g) strawberries with sugar-free whipped topping. Carbohydrate calculation 1. Identify the foods that contain carbohydrates: ? Rice. ? Corn. ? Milk. ? Strawberries. 2. Calculate how many servings you have of each food: ? 2 servings rice. ? 1 serving corn. ? 1 serving milk. ? 1 serving strawberries. 3. Multiply each number of servings by 15 g: ? 2 servings rice x 15 g = 30 g. ? 1 serving corn x 15 g = 15 g. ? 1 serving milk x 15 g = 15 g. ? 1  serving strawberries x 15 g = 15 g. 4. Add together all of the amounts to find the total grams of carbohydrates eaten: ? 30 g + 15 g + 15 g + 15 g = 75 g of carbohydrates total. Summary  Carbohydrate counting is a method of keeping track of how many carbohydrates you eat.  Eating carbohydrates naturally increases the amount of sugar (glucose) in the blood.  Counting how many carbohydrates you eat helps keep your blood glucose within normal limits, which helps you manage your diabetes.  A diet and nutrition specialist (registered dietitian) can help you make a meal plan and calculate how many carbohydrates you should have at each meal and snack. This information is not intended to replace advice given to you by your health care provider. Make sure you discuss any questions you have with your health care provider. Document Revised: 12/16/2016 Document Reviewed: 11/05/2015 Elsevier Patient Education  2020 Elsevier Inc.  

## 2020-02-05 ENCOUNTER — Ambulatory Visit: Payer: Self-pay | Admitting: Nurse Practitioner

## 2020-02-28 ENCOUNTER — Other Ambulatory Visit: Payer: Self-pay

## 2020-02-28 ENCOUNTER — Encounter: Payer: Self-pay | Admitting: Nurse Practitioner

## 2020-02-28 ENCOUNTER — Ambulatory Visit: Payer: BC Managed Care – PPO | Admitting: Nurse Practitioner

## 2020-02-28 VITALS — BP 112/74 | HR 78 | Temp 98.0°F | Resp 20 | Ht 71.0 in | Wt 226.0 lb

## 2020-02-28 DIAGNOSIS — Z6832 Body mass index (BMI) 32.0-32.9, adult: Secondary | ICD-10-CM

## 2020-02-28 DIAGNOSIS — E1169 Type 2 diabetes mellitus with other specified complication: Secondary | ICD-10-CM

## 2020-02-28 DIAGNOSIS — E785 Hyperlipidemia, unspecified: Secondary | ICD-10-CM

## 2020-02-28 DIAGNOSIS — E119 Type 2 diabetes mellitus without complications: Secondary | ICD-10-CM | POA: Diagnosis not present

## 2020-02-28 LAB — BAYER DCA HB A1C WAIVED: HB A1C (BAYER DCA - WAIVED): 8.3 % — ABNORMAL HIGH (ref <7.0)

## 2020-02-28 NOTE — Patient Instructions (Signed)
Exercising to Stay Healthy To become healthy and stay healthy, it is recommended that you do moderate-intensity and vigorous-intensity exercise. You can tell that you are exercising at a moderate intensity if your heart starts beating faster and you start breathing faster but can still hold a conversation. You can tell that you are exercising at a vigorous intensity if you are breathing much harder and faster and cannot hold a conversation while exercising. Exercising regularly is important. It has many health benefits, such as:  Improving overall fitness, flexibility, and endurance.  Increasing bone density.  Helping with weight control.  Decreasing body fat.  Increasing muscle strength.  Reducing stress and tension.  Improving overall health. How often should I exercise? Choose an activity that you enjoy, and set realistic goals. Your health care provider can help you make an activity plan that works for you. Exercise regularly as told by your health care provider. This may include:  Doing strength training two times a week, such as: ? Lifting weights. ? Using resistance bands. ? Push-ups. ? Sit-ups. ? Yoga.  Doing a certain intensity of exercise for a given amount of time. Choose from these options: ? A total of 150 minutes of moderate-intensity exercise every week. ? A total of 75 minutes of vigorous-intensity exercise every week. ? A mix of moderate-intensity and vigorous-intensity exercise every week. Children, pregnant women, people who have not exercised regularly, people who are overweight, and older adults may need to talk with a health care provider about what activities are safe to do. If you have a medical condition, be sure to talk with your health care provider before you start a new exercise program. What are some exercise ideas? Moderate-intensity exercise ideas include:  Walking 1 mile (1.6 km) in about 15  minutes.  Biking.  Hiking.  Golfing.  Dancing.  Water aerobics. Vigorous-intensity exercise ideas include:  Walking 4.5 miles (7.2 km) or more in about 1 hour.  Jogging or running 5 miles (8 km) in about 1 hour.  Biking 10 miles (16.1 km) or more in about 1 hour.  Lap swimming.  Roller-skating or in-line skating.  Cross-country skiing.  Vigorous competitive sports, such as football, basketball, and soccer.  Jumping rope.  Aerobic dancing. What are some everyday activities that can help me to get exercise?  Yard work, such as: ? Pushing a lawn mower. ? Raking and bagging leaves.  Washing your car.  Pushing a stroller.  Shoveling snow.  Gardening.  Washing windows or floors. How can I be more active in my day-to-day activities?  Use stairs instead of an elevator.  Take a walk during your lunch break.  If you drive, park your car farther away from your work or school.  If you take public transportation, get off one stop early and walk the rest of the way.  Stand up or walk around during all of your indoor phone calls.  Get up, stretch, and walk around every 30 minutes throughout the day.  Enjoy exercise with a friend. Support to continue exercising will help you keep a regular routine of activity. What guidelines can I follow while exercising?  Before you start a new exercise program, talk with your health care provider.  Do not exercise so much that you hurt yourself, feel dizzy, or get very short of breath.  Wear comfortable clothes and wear shoes with good support.  Drink plenty of water while you exercise to prevent dehydration or heat stroke.  Work out until your breathing   and your heartbeat get faster. Where to find more information  [place] Department of Health and Human Services: www.hhs.gov  Centers for Disease Control and Prevention (CDC): www.cdc.gov Summary  Exercising regularly is important. It will improve your overall fitness,  flexibility, and endurance.  Regular exercise also will improve your overall health. It can help you control your weight, reduce stress, and improve your bone density.  Do not exercise so much that you hurt yourself, feel dizzy, or get very short of breath.  Before you start a new exercise program, talk with your health care provider. This information is not intended to replace advice given to you by your health care provider. Make sure you discuss any questions you have with your health care provider. Document Revised: 05/06/2017 Document Reviewed: 04/14/2017 Elsevier Patient Education  2020 Elsevier Inc.  

## 2020-02-28 NOTE — Progress Notes (Signed)
Subjective:    Patient ID: Steven Mcgee, male    DOB: November 06, 1970, 49 y.o.   MRN: [id number]   Chief Complaint: No chief complaint on file.    HPI:  1. Type 2 diabetes mellitus without complication, without long-term current use of insulin (HCC) Lab Results  Component Value Date   HGBA1C 8.1 (H) 11/02/2019  Does not check CBG at home. Takes medication as prescribed. Does not watch diet. HA1C 8.3 today. He eats what ever he wants to and does not check his blood sugars at home.   2. Hyperlipidemia associated with type 2 diabetes mellitus ([place]) Lab Results  Component Value Date   CHOL 125 07/31/2019   HDL 38 (L) 07/31/2019   LDLCALC 47 07/31/2019   TRIG 254 (H) 07/31/2019   CHOLHDL 3.3 07/31/2019  Denies eating a lot of fried or fatty foods. Takes medication as prescribed.    3. 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     Outpatient Encounter Medications as of 02/28/2020  Medication Sig  . dapagliflozin propanediol (FARXIGA) 10 MG TABS tablet TAKE 1 TABLET BY MOUTH DAILY BEFORE BREAKFAST  . fenofibrate (TRICOR) 145 MG tablet TAKE 1 TABLET(145 MG) BY MOUTH DAILY  . fluticasone (FLONASE) 50 MCG/ACT nasal spray Place 1 spray into both nostrils daily.  Marland Kitchen glimepiride (AMARYL) 4 MG tablet Take 1 tablet (4 mg total) by mouth in the morning and at bedtime.  Marland Kitchen lisinopril (ZESTRIL) 10 MG tablet TAKE 1 TABLET(10 MG) BY MOUTH DAILY  . rosuvastatin (CRESTOR) 10 MG tablet Take 1 tablet (10 mg total) by mouth daily.  . sitaGLIPtin-metformin (JANUMET) 50-1000 MG tablet TAKE 1 TABLET BY MOUTH TWICE DAILY WITH A MEAL   No facility-administered encounter medications on file as of 02/28/2020.    Past Surgical History:  Procedure Laterality  Date  . APPENDECTOMY      Family History  Problem Relation Age of Onset  . Cancer Mother     New complaints: No new complaints today.   Social history: Lives with girlfriend, relaxes for fun, works often.   Controlled substance contract: n/a    Review of Systems  Constitutional: Negative.   HENT: Negative.   Eyes: Negative.   Respiratory: Negative.   Cardiovascular: Negative.   Gastrointestinal: Negative.   Endocrine: Negative.   Genitourinary: Negative.   Musculoskeletal: Negative.   Skin: Negative.   Allergic/Immunologic: Negative.   Neurological: Negative.   Hematological: Negative.   Psychiatric/Behavioral: Negative.   All other systems reviewed and are negative.      Objective:   Physical Exam Vitals and nursing note reviewed.  Constitutional:      Appearance: Normal appearance.  HENT:     Head: Normocephalic and atraumatic.     Right Ear: Tympanic membrane, ear canal and external ear normal.     Left Ear: Tympanic membrane, ear canal and external ear normal.     Nose: Nose normal.     Mouth/Throat:     Mouth: Mucous membranes are moist.     Pharynx: Oropharynx is clear.  Eyes:     Extraocular Movements: Extraocular movements intact.     Conjunctiva/sclera: Conjunctivae normal.     Pupils: Pupils are equal, round, and reactive to  light.  Cardiovascular:     Rate and Rhythm: Normal rate and regular rhythm.     Pulses: Normal pulses.     Heart sounds: Normal heart sounds.  Abdominal:     General: Bowel sounds are normal.     Palpations: Abdomen is soft.  Musculoskeletal:        General: Normal range of motion.     Cervical back: Normal range of motion.  Skin:    General: Skin is warm and dry.     Capillary Refill: Capillary refill takes less than 2 seconds.  Neurological:     General: No focal deficit present.     Mental Status: He is alert and oriented to person, place, and time. Mental status is at baseline.  Psychiatric:        Mood and  Affect: Mood normal.        Behavior: Behavior normal.        Thought Content: Thought content normal.        Judgment: Judgment normal.    BP 112/74   Pulse 78   Temp 98 F (36.7 C) (Temporal)   Resp 20   Ht 5' 11" (1.803 m)   Wt 226 lb (102.5 kg)   BMI 31.52 kg/m   hgba1c 8.3%    Assessment & Plan:  Steven Mcgee comes in today with chief complaint of No chief complaint on file.   Diagnosis and orders addressed:  1. Type 2 diabetes mellitus without complication, without long-term current use of insulin (HCC) Check blood glucose levels regularly and take medication as prescribed. Avoid foods that are high in empty carbs and sugar content.  He refuses medication change - Bayer DCA Hb A1c Waived  2. Hyperlipidemia associated with type 2 diabetes mellitus ([place]) Take medication as prescribed. Avoid high fat and fried foods.   - Lipid panel  3. BMI 32.0-32.9,adult Exercise regularly. Walking and swimming are great low-impact exercises that lower blood pressure, blood glucose levels, and cholesterol levels.   - CBC with Differential/Platelet - CMP14+EGFR   Labs pending Health Maintenance reviewed Diet and exercise encouraged  Follow up plan: Follow up in 3 months.    [name] Hassell Done, FNP

## 2020-02-29 LAB — CMP14+EGFR
ALT: 31 IU/L (ref 0–44)
AST: 25 IU/L (ref 0–40)
Albumin/Globulin Ratio: 2.3 — ABNORMAL HIGH (ref 1.2–2.2)
Albumin: 5.2 g/dL — ABNORMAL HIGH (ref 3.8–4.9)
Alkaline Phosphatase: 39 IU/L — ABNORMAL LOW (ref 44–121)
BUN/Creatinine Ratio: 15 (ref 9–20)
BUN: 19 mg/dL (ref 6–24)
Bilirubin Total: 0.5 mg/dL (ref 0.0–1.2)
CO2: 26 mmol/L (ref 20–29)
Calcium: 10 mg/dL (ref 8.7–10.2)
Chloride: 98 mmol/L (ref 96–106)
Creatinine, Ser: 1.27 mg/dL (ref 0.76–1.27)
GFR calc Af Amer: 75 mL/min/{1.73_m2} (ref >59)
GFR calc non Af Amer: 65 mL/min/{1.73_m2} (ref >59)
Globulin, Total: 2.3 g/dL (ref 1.5–4.5)
Glucose: 238 mg/dL — ABNORMAL HIGH (ref 65–99)
Potassium: 4.6 mmol/L (ref 3.5–5.2)
Sodium: 136 mmol/L (ref 134–144)
Total Protein: 7.5 g/dL (ref 6.0–8.5)

## 2020-02-29 LAB — CBC WITH DIFFERENTIAL/PLATELET
Basophils Absolute: 0.1 10*3/uL (ref 0.0–0.2)
Basos: 1 %
EOS (ABSOLUTE): 0.1 10*3/uL (ref 0.0–0.4)
Eos: 1 %
Hematocrit: 50.7 % (ref 37.5–51.0)
Hemoglobin: 16.3 g/dL (ref 13.0–17.7)
Immature Grans (Abs): 0 10*3/uL (ref 0.0–0.1)
Immature Granulocytes: 1 %
Lymphocytes Absolute: 2.4 10*3/uL (ref 0.7–3.1)
Lymphs: 35 %
MCH: 28.7 pg (ref 26.6–33.0)
MCHC: 32.1 g/dL (ref 31.5–35.7)
MCV: 89 fL (ref 79–97)
Monocytes Absolute: 0.6 10*3/uL (ref 0.1–0.9)
Monocytes: 8 %
Neutrophils Absolute: 3.6 10*3/uL (ref 1.4–7.0)
Neutrophils: 54 %
Platelets: 259 10*3/uL (ref 150–450)
RBC: 5.68 x10E6/uL (ref 4.14–5.80)
RDW: 12.6 % (ref 11.6–15.4)
WBC: 6.7 10*3/uL (ref 3.4–10.8)

## 2020-02-29 LAB — LIPID PANEL
Chol/HDL Ratio: 3.5 ratio (ref 0.0–5.0)
Cholesterol, Total: 126 mg/dL (ref 100–199)
HDL: 36 mg/dL — ABNORMAL LOW (ref >39)
LDL Chol Calc (NIH): 43 mg/dL (ref 0–99)
Triglycerides: 309 mg/dL — ABNORMAL HIGH (ref 0–149)
VLDL Cholesterol Cal: 47 mg/dL — ABNORMAL HIGH (ref 5–40)

## 2020-04-21 ENCOUNTER — Other Ambulatory Visit: Payer: Self-pay

## 2020-04-21 ENCOUNTER — Ambulatory Visit (INDEPENDENT_AMBULATORY_CARE_PROVIDER_SITE_OTHER): Payer: Self-pay | Admitting: Nurse Practitioner

## 2020-04-21 ENCOUNTER — Encounter: Payer: Self-pay | Admitting: Nurse Practitioner

## 2020-04-21 DIAGNOSIS — Z0289 Encounter for other administrative examinations: Secondary | ICD-10-CM

## 2020-04-21 DIAGNOSIS — Z024 Encounter for examination for driving license: Secondary | ICD-10-CM

## 2020-04-21 LAB — URINALYSIS
Bilirubin, UA: NEGATIVE
Ketones, UA: NEGATIVE
Leukocytes,UA: NEGATIVE
Nitrite, UA: NEGATIVE
Protein,UA: NEGATIVE
RBC, UA: NEGATIVE
Specific Gravity, UA: 1.02 (ref 1.005–1.030)
Urobilinogen, Ur: 0.2 mg/dL (ref 0.2–1.0)
pH, UA: 5 (ref 5.0–7.5)

## 2020-04-21 NOTE — Progress Notes (Signed)
Patient ID: Steven Mcgee, male   DOB: July 16, 1970, 49 y.o.   MRN: [id number]  DOT physical- sees canned document

## 2020-05-11 ENCOUNTER — Other Ambulatory Visit: Payer: Self-pay | Admitting: Nurse Practitioner

## 2020-05-11 DIAGNOSIS — E119 Type 2 diabetes mellitus without complications: Secondary | ICD-10-CM

## 2020-05-29 ENCOUNTER — Other Ambulatory Visit: Payer: Self-pay

## 2020-05-29 ENCOUNTER — Ambulatory Visit: Payer: BC Managed Care – PPO | Admitting: Nurse Practitioner

## 2020-05-29 ENCOUNTER — Encounter: Payer: Self-pay | Admitting: Nurse Practitioner

## 2020-05-29 VITALS — BP 109/73 | HR 80 | Temp 98.0°F | Resp 20 | Ht 71.0 in | Wt 223.0 lb

## 2020-05-29 DIAGNOSIS — E119 Type 2 diabetes mellitus without complications: Secondary | ICD-10-CM | POA: Diagnosis not present

## 2020-05-29 DIAGNOSIS — E785 Hyperlipidemia, unspecified: Secondary | ICD-10-CM

## 2020-05-29 DIAGNOSIS — E1169 Type 2 diabetes mellitus with other specified complication: Secondary | ICD-10-CM | POA: Diagnosis not present

## 2020-05-29 DIAGNOSIS — Z6832 Body mass index (BMI) 32.0-32.9, adult: Secondary | ICD-10-CM

## 2020-05-29 DIAGNOSIS — R809 Proteinuria, unspecified: Secondary | ICD-10-CM

## 2020-05-29 DIAGNOSIS — Z125 Encounter for screening for malignant neoplasm of prostate: Secondary | ICD-10-CM | POA: Diagnosis not present

## 2020-05-29 LAB — BAYER DCA HB A1C WAIVED: HB A1C (BAYER DCA - WAIVED): 9 % — ABNORMAL HIGH (ref <7.0)

## 2020-05-29 MED ORDER — LISINOPRIL 10 MG PO TABS: ORAL_TABLET | ORAL | 1 refills | Status: DC

## 2020-05-29 MED ORDER — TRULICITY 0.75 MG/0.5ML ~~LOC~~ SOAJ: 0.7500 mg | SUBCUTANEOUS | 1 refills | Status: DC

## 2020-05-29 MED ORDER — FENOFIBRATE 145 MG PO TABS
ORAL_TABLET | ORAL | 1 refills | Status: DC
Start: 2020-05-29 — End: 2020-08-28

## 2020-05-29 MED ORDER — METFORMIN HCL 1000 MG PO TABS: 1000.0000 mg | ORAL_TABLET | Freq: Two times a day (BID) | ORAL | 3 refills | Status: DC

## 2020-05-29 MED ORDER — DAPAGLIFLOZIN PROPANEDIOL 10 MG PO TABS: 10.0000 mg | ORAL_TABLET | Freq: Every day | ORAL | 1 refills | Status: DC

## 2020-05-29 MED ORDER — ROSUVASTATIN CALCIUM 10 MG PO TABS: 10.0000 mg | ORAL_TABLET | Freq: Every day | ORAL | 1 refills | Status: DC

## 2020-05-29 MED ORDER — GLIMEPIRIDE 4 MG PO TABS: 4.0000 mg | ORAL_TABLET | Freq: Two times a day (BID) | ORAL | 1 refills | Status: DC

## 2020-05-29 NOTE — Patient Instructions (Signed)
Carbohydrate Counting for Diabetes Mellitus, Adult  Carbohydrate counting is a method of keeping track of how many carbohydrates you eat. Eating carbohydrates naturally increases the amount of sugar (glucose) in the blood. Counting how many carbohydrates you eat helps keep your blood glucose within normal limits, which helps you manage your diabetes (diabetes mellitus). It is important to know how many carbohydrates you can safely have in each meal. This is different for every person. A diet and nutrition specialist (registered dietitian) can help you make a meal plan and calculate how many carbohydrates you should have at each meal and snack. Carbohydrates are found in the following foods:  Grains, such as breads and cereals.  Dried beans and soy products.  Starchy vegetables, such as potatoes, peas, and corn.  Fruit and fruit juices.  Milk and yogurt.  Sweets and snack foods, such as cake, cookies, candy, chips, and soft drinks. How do I count carbohydrates? There are two ways to count carbohydrates in food. You can use either of the methods or a combination of both. Reading "Nutrition Facts" on packaged food The "Nutrition Facts" list is included on the labels of almost all packaged foods and beverages in the [place] It includes:  The serving size.  Information about nutrients in each serving, including the grams (g) of carbohydrate per serving. To use the "Nutrition Facts":  Decide how many servings you will have.  Multiply the number of servings by the number of carbohydrates per serving.  The resulting number is the total amount of carbohydrates that you will be having. Learning standard serving sizes of other foods When you eat carbohydrate foods that are not packaged or do not include "Nutrition Facts" on the label, you need to measure the servings in order to count the amount of carbohydrates:  Measure the foods that you will eat with a food scale or measuring cup, if  needed.  Decide how many standard-size servings you will eat.  Multiply the number of servings by 15. Most carbohydrate-rich foods have about 15 g of carbohydrates per serving. ? For example, if you eat 8 oz (170 g) of strawberries, you will have eaten 2 servings and 30 g of carbohydrates (2 servings x 15 g = 30 g).  For foods that have more than one food mixed, such as soups and casseroles, you must count the carbohydrates in each food that is included. The following list contains standard serving sizes of common carbohydrate-rich foods. Each of these servings has about 15 g of carbohydrates:   hamburger bun or  English muffin.   oz (15 mL) syrup.   oz (14 g) jelly.  1 slice of bread.  1 six-inch tortilla.  3 oz (85 g) cooked rice or pasta.  4 oz (113 g) cooked dried beans.  4 oz (113 g) starchy vegetable, such as peas, corn, or potatoes.  4 oz (113 g) hot cereal.  4 oz (113 g) mashed potatoes or  of a large baked potato.  4 oz (113 g) canned or frozen fruit.  4 oz (120 mL) fruit juice.  4-6 crackers.  6 chicken nuggets.  6 oz (170 g) unsweetened dry cereal.  6 oz (170 g) plain fat-free yogurt or yogurt sweetened with artificial sweeteners.  8 oz (240 mL) milk.  8 oz (170 g) fresh fruit or one small piece of fruit.  24 oz (680 g) popped popcorn. Example of carbohydrate counting Sample meal  3 oz (85 g) chicken breast.  6 oz (170 g)   brown rice.  4 oz (113 g) corn.  8 oz (240 mL) milk.  8 oz (170 g) strawberries with sugar-free whipped topping. Carbohydrate calculation 1. Identify the foods that contain carbohydrates: ? Rice. ? Corn. ? Milk. ? Strawberries. 2. Calculate how many servings you have of each food: ? 2 servings rice. ? 1 serving corn. ? 1 serving milk. ? 1 serving strawberries. 3. Multiply each number of servings by 15 g: ? 2 servings rice x 15 g = 30 g. ? 1 serving corn x 15 g = 15 g. ? 1 serving milk x 15 g = 15 g. ? 1  serving strawberries x 15 g = 15 g. 4. Add together all of the amounts to find the total grams of carbohydrates eaten: ? 30 g + 15 g + 15 g + 15 g = 75 g of carbohydrates total. Summary  Carbohydrate counting is a method of keeping track of how many carbohydrates you eat.  Eating carbohydrates naturally increases the amount of sugar (glucose) in the blood.  Counting how many carbohydrates you eat helps keep your blood glucose within normal limits, which helps you manage your diabetes.  A diet and nutrition specialist (registered dietitian) can help you make a meal plan and calculate how many carbohydrates you should have at each meal and snack. This information is not intended to replace advice given to you by your health care provider. Make sure you discuss any questions you have with your health care provider. Document Revised: 12/16/2016 Document Reviewed: 11/05/2015 Elsevier Patient Education  2020 Elsevier Inc.  

## 2020-05-29 NOTE — Progress Notes (Signed)
Subjective:    Patient ID: Steven Mcgee, male    DOB: Nov 30, 1970, 49 y.o.   MRN: [id number]   Chief Complaint: Medical Management of Chronic Issues    HPI:  1. Type 2 diabetes mellitus without complication, without long-term current use of insulin (HCC) He has not been checking his blood sugars. He has not been watching his diet at all. Has been eating a lot of junk. He has refused medication chenge sin the past. Lab Results  Component Value Date   HGBA1C 8.3 (H) 02/28/2020     2. Hyperlipidemia associated with type 2 diabetes mellitus ([place]) Does not watch diet and does no exercise. Lab Results  Component Value Date   CHOL 126 02/28/2020   HDL 36 (L) 02/28/2020   LDLCALC 43 02/28/2020   TRIG 309 (H) 02/28/2020   CHOLHDL 3.5 02/28/2020     3. Prostate cancer screening Denies any problem voiding  4. BMI 32.0-32.9,adult No recent weight changes Wt Readings from Last 3 Encounters:  05/29/20 223 lb (101.2 kg)  02/28/20 226 lb (102.5 kg)  11/02/19 220 lb (99.8 kg)    BMI Readings from Last 3 Encounters:  05/29/20 31.10 kg/m  02/28/20 31.52 kg/m  11/02/19 30.68 kg/m       Outpatient Encounter Medications as of 05/29/2020  Medication Sig  . FARXIGA 10 MG TABS tablet TAKE 1 TABLET BY MOUTH EVERY DAY BEFORE BREAKFAST  . fenofibrate (TRICOR) 145 MG tablet TAKE 1 TABLET(145 MG) BY MOUTH DAILY  . fluticasone (FLONASE) 50 MCG/ACT nasal spray Place 1 spray into both nostrils daily.  Marland Kitchen glimepiride (AMARYL) 4 MG tablet Take 1 tablet (4 mg total) by mouth in the morning and at bedtime.  Marland Kitchen lisinopril (ZESTRIL) 10 MG tablet TAKE 1 TABLET(10 MG) BY MOUTH DAILY  . rosuvastatin (CRESTOR) 10 MG tablet Take 1 tablet (10 mg total) by mouth daily.  . sitaGLIPtin-metformin (JANUMET) 50-1000 MG tablet TAKE 1 TABLET BY MOUTH TWICE DAILY WITH A MEAL    Past Surgical History:  Procedure Laterality Date  . APPENDECTOMY      Family History  Problem Relation Age of Onset   . Cancer Mother     New complaints: None today  Social history: Lives with girlfriend  Controlled substance contract: n/a    Review of Systems  Constitutional: Negative for diaphoresis.  Eyes: Negative for pain.  Respiratory: Negative for shortness of breath.   Cardiovascular: Negative for chest pain, palpitations and leg swelling.  Gastrointestinal: Negative for abdominal pain.  Endocrine: Negative for polydipsia.  Skin: Negative for rash.  Neurological: Negative for dizziness, weakness and headaches.  Hematological: Does not bruise/bleed easily.  All other systems reviewed and are negative.      Objective:   Physical Exam Vitals and nursing note reviewed.  Constitutional:      Appearance: Normal appearance. He is well-developed and well-nourished.  HENT:     Head: Normocephalic.     Nose: Nose normal.     Mouth/Throat:     Mouth: Oropharynx is clear and moist.  Eyes:     Extraocular Movements: EOM normal.     Pupils: Pupils are equal, round, and reactive to light.  Neck:     Thyroid: No thyroid mass or thyromegaly.     Vascular: No carotid bruit or JVD.     Trachea: Phonation normal.  Cardiovascular:     Rate and Rhythm: Normal rate and regular rhythm.  Pulmonary:     Effort: Pulmonary effort is normal.  No respiratory distress.     Breath sounds: Normal breath sounds.  Abdominal:     General: Bowel sounds are normal. Aorta is normal.     Palpations: Abdomen is soft.     Tenderness: There is no abdominal tenderness.  Musculoskeletal:        General: Normal range of motion.     Cervical back: Normal range of motion and neck supple.  Lymphadenopathy:     Cervical: No cervical adenopathy.  Skin:    General: Skin is warm and dry.  Neurological:     Mental Status: He is alert and oriented to person, place, and time.  Psychiatric:        Mood and Affect: Mood and affect normal.        Behavior: Behavior normal.        Thought Content: Thought content  normal.        Judgment: Judgment normal.     BP 109/73   Pulse 80   Temp 98 F (36.7 C) (Temporal)   Resp 20   Ht 5' 11"  (1.803 m)   Wt 223 lb (101.2 kg)   SpO2 97%   BMI 31.10 kg/m        Assessment & Plan:  Steven Mcgee comes in today with chief complaint of Medical Management of Chronic Issues   Diagnosis and orders addressed:  1. Type 2 diabetes mellitus without complication, without long-term current use of insulin (HCC) Stop janumet Start metformin 1031 mg BID trulicity 2.81 1 weekly Keep diary of blood sugars  Watch carbs in diet - Bayer DCA Hb A1c Waived - CMP14+EGFR - CBC with Differential/Platelet - Microalbumin / creatinine urine ratio - metFORMIN (GLUCOPHAGE) 1000 MG tablet; Take 1 tablet (1,000 mg total) by mouth 2 (two) times daily with a meal.  Dispense: 180 tablet; Refill: 3 - dapagliflozin propanediol (FARXIGA) 10 MG TABS tablet; Take 1 tablet (10 mg total) by mouth daily.  Dispense: 90 tablet; Refill: 1 - glimepiride (AMARYL) 4 MG tablet; Take 1 tablet (4 mg total) by mouth in the morning and at bedtime.  Dispense: 180 tablet; Refill: 1 - Dulaglutide (TRULICITY) 1.88 QL/7.3PV SOPN; Inject 0.75 mg into the skin once a week.  Dispense: 12 mL; Refill: 1  2. Hyperlipidemia associated with type 2 diabetes mellitus (HCC) Low fat diet - Lipid panel - rosuvastatin (CRESTOR) 10 MG tablet; Take 1 tablet (10 mg total) by mouth daily.  Dispense: 90 tablet; Refill: 1 - fenofibrate (TRICOR) 145 MG tablet; TAKE 1 TABLET(145 MG) BY MOUTH DAILY  Dispense: 90 tablet; Refill: 1  3. Prostate cancer screening Labs pending - PSA, total and free  4. BMI 32.0-32.9,adult Discussed diet and exercise for person with BMI >25 Will recheck weight in 3-6 months  5. Microalbuminuria - lisinopril (ZESTRIL) 10 MG tablet; TAKE 1 TABLET(10 MG) BY MOUTH DAILY  Dispense: 90 tablet; Refill: 1   Labs pending Health Maintenance reviewed Diet and exercise  encouraged  Follow up plan: 3 months   [name] Hassell Done, FNP

## 2020-05-30 LAB — LIPID PANEL
Chol/HDL Ratio: 4 ratio (ref 0.0–5.0)
Cholesterol, Total: 145 mg/dL (ref 100–199)
HDL: 36 mg/dL — ABNORMAL LOW (ref >39)
LDL Chol Calc (NIH): 69 mg/dL (ref 0–99)
Triglycerides: 244 mg/dL — ABNORMAL HIGH (ref 0–149)
VLDL Cholesterol Cal: 40 mg/dL (ref 5–40)

## 2020-05-30 LAB — CMP14+EGFR
ALT: 35 IU/L (ref 0–44)
AST: 25 IU/L (ref 0–40)
Albumin/Globulin Ratio: 2 (ref 1.2–2.2)
Albumin: 4.8 g/dL (ref 3.8–4.9)
Alkaline Phosphatase: 39 IU/L — ABNORMAL LOW (ref 44–121)
BUN/Creatinine Ratio: 16 (ref 9–20)
BUN: 21 mg/dL (ref 6–24)
Bilirubin Total: 0.4 mg/dL (ref 0.0–1.2)
CO2: 23 mmol/L (ref 20–29)
Calcium: 9.6 mg/dL (ref 8.7–10.2)
Chloride: 99 mmol/L (ref 96–106)
Creatinine, Ser: 1.3 mg/dL — ABNORMAL HIGH (ref 0.76–1.27)
GFR calc Af Amer: 73 mL/min/{1.73_m2} (ref >59)
GFR calc non Af Amer: 63 mL/min/{1.73_m2} (ref >59)
Globulin, Total: 2.4 g/dL (ref 1.5–4.5)
Glucose: 227 mg/dL — ABNORMAL HIGH (ref 65–99)
Potassium: 4.8 mmol/L (ref 3.5–5.2)
Sodium: 138 mmol/L (ref 134–144)
Total Protein: 7.2 g/dL (ref 6.0–8.5)

## 2020-05-30 LAB — CBC WITH DIFFERENTIAL/PLATELET
Basophils Absolute: 0 10*3/uL (ref 0.0–0.2)
Basos: 1 %
EOS (ABSOLUTE): 0.1 10*3/uL (ref 0.0–0.4)
Eos: 1 %
Hematocrit: 46.9 % (ref 37.5–51.0)
Hemoglobin: 15.8 g/dL (ref 13.0–17.7)
Immature Grans (Abs): 0 10*3/uL (ref 0.0–0.1)
Immature Granulocytes: 0 %
Lymphocytes Absolute: 2.6 10*3/uL (ref 0.7–3.1)
Lymphs: 44 %
MCH: 28.9 pg (ref 26.6–33.0)
MCHC: 33.7 g/dL (ref 31.5–35.7)
MCV: 86 fL (ref 79–97)
Monocytes Absolute: 0.5 10*3/uL (ref 0.1–0.9)
Monocytes: 9 %
Neutrophils Absolute: 2.5 10*3/uL (ref 1.4–7.0)
Neutrophils: 45 %
Platelets: 258 10*3/uL (ref 150–450)
RBC: 5.46 x10E6/uL (ref 4.14–5.80)
RDW: 12.9 % (ref 11.6–15.4)
WBC: 5.7 10*3/uL (ref 3.4–10.8)

## 2020-05-30 LAB — PSA, TOTAL AND FREE
PSA, Free Pct: 23 %
PSA, Free: 0.23 ng/mL
Prostate Specific Ag, Serum: 1 ng/mL (ref 0.0–4.0)

## 2020-06-13 ENCOUNTER — Telehealth: Payer: Self-pay

## 2020-06-13 DIAGNOSIS — E119 Type 2 diabetes mellitus without complications: Secondary | ICD-10-CM

## 2020-06-13 NOTE — Telephone Encounter (Signed)
Pt called stating that he needs more samples of the sugar injection MMM has him trying.   Please call patient when samples are available.   (pt not sure what the name of medicine is)

## 2020-06-13 NOTE — Telephone Encounter (Signed)
I don't see the 0.75 dose of Trulicity in the fridge. Did you want a different dose for him? Pt states he was told not to get at pharmacy till you seen how he did on it.

## 2020-06-13 NOTE — Telephone Encounter (Signed)
We do not currently have samples. How is his blood sugar doing?

## 2020-06-16 MED ORDER — TRULICITY 0.75 MG/0.5ML ~~LOC~~ SOAJ: 0.7500 mg | SUBCUTANEOUS | 1 refills | Status: DC

## 2020-06-16 NOTE — Telephone Encounter (Signed)
Left message to call back  

## 2020-06-16 NOTE — Addendum Note (Signed)
Addended by: Bennie Pierini on: 06/16/2020 02:03 PM   Modules accepted: Orders

## 2020-06-16 NOTE — Telephone Encounter (Signed)
Rc for nurse 

## 2020-06-16 NOTE — Telephone Encounter (Signed)
Spoke with patient, he reports he does not check his blood sugars at home because he does not have a meter and he "doesn't worry about it as long as my A1C is good enough to get my DOT."  He asked if you would go ahead and send in a prescription for the Trulicity.

## 2020-08-28 ENCOUNTER — Other Ambulatory Visit: Payer: Self-pay

## 2020-08-28 ENCOUNTER — Ambulatory Visit: Payer: BC Managed Care – PPO | Admitting: Nurse Practitioner

## 2020-08-28 ENCOUNTER — Encounter: Payer: Self-pay | Admitting: Nurse Practitioner

## 2020-08-28 VITALS — BP 117/71 | HR 84 | Temp 99.1°F | Resp 20 | Ht 71.0 in | Wt 223.0 lb

## 2020-08-28 DIAGNOSIS — R809 Proteinuria, unspecified: Secondary | ICD-10-CM | POA: Diagnosis not present

## 2020-08-28 DIAGNOSIS — E1169 Type 2 diabetes mellitus with other specified complication: Secondary | ICD-10-CM | POA: Diagnosis not present

## 2020-08-28 DIAGNOSIS — M25512 Pain in left shoulder: Secondary | ICD-10-CM | POA: Diagnosis not present

## 2020-08-28 DIAGNOSIS — Z6832 Body mass index (BMI) 32.0-32.9, adult: Secondary | ICD-10-CM | POA: Diagnosis not present

## 2020-08-28 DIAGNOSIS — E119 Type 2 diabetes mellitus without complications: Secondary | ICD-10-CM | POA: Diagnosis not present

## 2020-08-28 DIAGNOSIS — E785 Hyperlipidemia, unspecified: Secondary | ICD-10-CM

## 2020-08-28 LAB — BAYER DCA HB A1C WAIVED: HB A1C (BAYER DCA - WAIVED): 7.9 % — ABNORMAL HIGH (ref <7.0)

## 2020-08-28 MED ORDER — ROSUVASTATIN CALCIUM 10 MG PO TABS: 10.0000 mg | ORAL_TABLET | Freq: Every day | ORAL | 1 refills | Status: DC

## 2020-08-28 MED ORDER — METFORMIN HCL 1000 MG PO TABS: 1000.0000 mg | ORAL_TABLET | Freq: Two times a day (BID) | ORAL | 3 refills | Status: DC

## 2020-08-28 MED ORDER — BUPIVACAINE HCL 0.25 % IJ SOLN
1.0000 mL | Freq: Once | INTRAMUSCULAR | Status: AC
  Administered 2020-08-28: 1 mL via INTRA_ARTICULAR

## 2020-08-28 MED ORDER — GLIMEPIRIDE 4 MG PO TABS: 4.0000 mg | ORAL_TABLET | Freq: Two times a day (BID) | ORAL | 1 refills | Status: DC

## 2020-08-28 MED ORDER — DAPAGLIFLOZIN PROPANEDIOL 10 MG PO TABS
10.0000 mg | ORAL_TABLET | Freq: Every day | ORAL | 1 refills | Status: DC
Start: 2020-08-28 — End: 2020-12-30

## 2020-08-28 MED ORDER — TRULICITY 0.75 MG/0.5ML ~~LOC~~ SOAJ: 0.7500 mg | SUBCUTANEOUS | 1 refills | Status: DC

## 2020-08-28 MED ORDER — FENOFIBRATE 145 MG PO TABS
ORAL_TABLET | ORAL | 1 refills | Status: DC
Start: 2020-08-28 — End: 2021-01-01

## 2020-08-28 MED ORDER — LISINOPRIL 10 MG PO TABS
ORAL_TABLET | ORAL | 1 refills | Status: DC
Start: 2020-08-28 — End: 2021-01-01

## 2020-08-28 MED ORDER — METHYLPREDNISOLONE ACETATE 40 MG/ML IJ SUSP
40.0000 mg | Freq: Once | INTRAMUSCULAR | Status: AC
  Administered 2020-08-28: 40 mg via INTRAMUSCULAR

## 2020-08-28 NOTE — Progress Notes (Signed)
Subjective:    Patient ID: Steven Mcgee, male    DOB: 07/05/1970, 50 y.o.   MRN: [id number]   Chief Complaint: medical management of chronic issues     HPI:  1. Hyperlipidemia associated with type 2 diabetes mellitus ([place]) Doe snot really watch diet and does no dedicated exercise. Lab Results  Component Value Date   CHOL 145 05/29/2020   HDL 36 (L) 05/29/2020   LDLCALC 69 05/29/2020   TRIG 244 (H) 05/29/2020   CHOLHDL 4.0 05/29/2020     2. Type 2 diabetes mellitus without complication, without long-term current use of insulin ([place]) Patient does not check blood sugars at home. He really does not watch his diet either. Lab Results  Component Value Date   HGBA1C 9.0 (H) 05/29/2020     3. BMI 32.0-32.9,adult No recent weight changes Wt Readings from Last 3 Encounters:  08/28/20 223 lb (101.2 kg)  05/29/20 223 lb (101.2 kg)  02/28/20 226 lb (102.5 kg)   BMI Readings from Last 3 Encounters:  08/28/20 31.10 kg/m  05/29/20 31.10 kg/m  02/28/20 31.52 kg/m       Outpatient Encounter Medications as of 08/28/2020  Medication Sig  . dapagliflozin propanediol (FARXIGA) 10 MG TABS tablet Take 1 tablet (10 mg total) by mouth daily.  . Dulaglutide (TRULICITY) 9.82 ME/1.5AX SOPN Inject 0.75 mg into the skin once a week.  . fenofibrate (TRICOR) 145 MG tablet TAKE 1 TABLET(145 MG) BY MOUTH DAILY  . fluticasone (FLONASE) 50 MCG/ACT nasal spray Place 1 spray into both nostrils daily.  Marland Kitchen glimepiride (AMARYL) 4 MG tablet Take 1 tablet (4 mg total) by mouth in the morning and at bedtime.  Marland Kitchen lisinopril (ZESTRIL) 10 MG tablet TAKE 1 TABLET(10 MG) BY MOUTH DAILY  . metFORMIN (GLUCOPHAGE) 1000 MG tablet Take 1 tablet (1,000 mg total) by mouth 2 (two) times daily with a meal.  . rosuvastatin (CRESTOR) 10 MG tablet Take 1 tablet (10 mg total) by mouth daily.   No facility-administered encounter medications on file as of 08/28/2020.    Past Surgical History:  Procedure  Laterality Date  . APPENDECTOMY      Family History  Problem Relation Age of Onset  . Cancer Mother     New complaints: Having left shouderpain. Has been hurting for a couple of years. Worse when he moves a certain way. Denies any numbness distally  Social history: Lives with his girlfriend  Controlled substance contract: 08/28/20    Review of Systems  Constitutional: Negative for diaphoresis.  Eyes: Negative for pain.  Respiratory: Negative for shortness of breath.   Cardiovascular: Negative for chest pain, palpitations and leg swelling.  Gastrointestinal: Negative for abdominal pain.  Endocrine: Negative for polydipsia.  Skin: Negative for rash.  Neurological: Negative for dizziness, weakness and headaches.  Hematological: Does not bruise/bleed easily.  All other systems reviewed and are negative.      Objective:   Physical Exam Vitals and nursing note reviewed.  Constitutional:      Appearance: Normal appearance. He is well-developed.  HENT:     Head: Normocephalic.     Nose: Nose normal.  Eyes:     Pupils: Pupils are equal, round, and reactive to light.  Neck:     Thyroid: No thyroid mass or thyromegaly.     Vascular: No carotid bruit or JVD.     Trachea: Phonation normal.  Cardiovascular:     Rate and Rhythm: Normal rate and regular rhythm.  Pulmonary:  Effort: Pulmonary effort is normal. No respiratory distress.     Breath sounds: Normal breath sounds.  Abdominal:     General: Bowel sounds are normal.     Palpations: Abdomen is soft.     Tenderness: There is no abdominal tenderness.  Musculoskeletal:        General: Normal range of motion.     Cervical back: Normal range of motion and neck supple.     Comments: FROM of left shoulder with pain on abduction and internal rotation Grips equal bil  Lymphadenopathy:     Cervical: No cervical adenopathy.  Skin:    General: Skin is warm and dry.  Neurological:     Mental Status: He is alert and  oriented to person, place, and time.  Psychiatric:        Behavior: Behavior normal.        Thought Content: Thought content normal.        Judgment: Judgment normal.      BP 117/71   Pulse 84   Temp 99.1 F (37.3 C) (Temporal)   Resp 20   Ht 5' 11"  (1.803 m)   Wt 223 lb (101.2 kg)   SpO2 97%   BMI 31.10 kg/m   hgba1c 7.8%  Joint Injection/Arthrocentesis  Date/Time: 08/28/2020 4:07 PM Performed by: Chevis Pretty, FNP Authorized by: Hassell Done [name], FNP  Indications: pain  Body area: shoulder Joint: left shoulder Local anesthesia used: no  Anesthesia: Local anesthesia used: no  Sedation: Patient sedated: no  Needle size: 22 G Ultrasound guidance: no Approach: posterior Methylprednisolone amount: 40 mg Bupivacaine 0.25% amount: 1 mL Patient tolerance: patient tolerated the procedure well with no immediate complications        Assessment & Plan:  Zailen Albarran comes in today with chief complaint of Medical Management of Chronic Issues   Diagnosis and orders addressed:  1. Hyperlipidemia associated with type 2 diabetes mellitus (HCC) Low fat diet - CBC with Differential/Platelet - CMP14+EGFR - Lipid panel - rosuvastatin (CRESTOR) 10 MG tablet; Take 1 tablet (10 mg total) by mouth daily.  Dispense: 90 tablet; Refill: 1 - fenofibrate (TRICOR) 145 MG tablet; TAKE 1 TABLET(145 MG) BY MOUTH DAILY  Dispense: 90 tablet; Refill: 1  2. Type 2 diabetes mellitus without complication, without long-term current use of insulin (HCC) Watch carbs in diet Patient refuses to check his blood sugars - Bayer DCA Hb A1c Waived - Dulaglutide (TRULICITY) 4.49 PN/3.0YF SOPN; Inject 0.75 mg into the skin once a week.  Dispense: 12 mL; Refill: 1 - glimepiride (AMARYL) 4 MG tablet; Take 1 tablet (4 mg total) by mouth in the morning and at bedtime.  Dispense: 180 tablet; Refill: 1 - dapagliflozin propanediol (FARXIGA) 10 MG TABS tablet; Take 1 tablet (10 mg  total) by mouth daily.  Dispense: 90 tablet; Refill: 1 - metFORMIN (GLUCOPHAGE) 1000 MG tablet; Take 1 tablet (1,000 mg total) by mouth 2 (two) times daily with a meal.  Dispense: 180 tablet; Refill: 3  3. BMI 32.0-32.9,adult Discussed diet and exercise for person with BMI >25 Will recheck weight in 3-6 months   4. Microalbuminuria  lisinopril (ZESTRIL) 10 MG tablet; TAKE 1 TABLET(10 MG) BY MOUTH DAILY  Dispense: 90 tablet; Refill: 1  5. Left shoulder pain After injection care given  Labs pending Health Maintenance reviewed Diet and exercise encouraged  Follow up plan: 3 months   [name] Hassell Done, FNP

## 2020-08-28 NOTE — Patient Instructions (Signed)
Joint Steroid Injection A joint steroid injection is a procedure to relieve swelling and pain in a joint. Steroids are medicines that reduce inflammation. In this procedure, your health care provider uses a syringe and a needle to inject a steroid medicine into a painful and inflamed joint. A pain-relieving medicine (anesthetic) may be injected along with the steroid. In some cases, your health care provider may use an imaging technique such as ultrasound or fluoroscopy to guide the injection. Joints that are often treated with steroid injections include the knee, shoulder, hip, and spine. These injections may also be used in the elbow, ankle, and joints of the hands or feet. You may have joint steroid injections as part of your treatment for inflammation caused by:  Gout.  Rheumatoid arthritis.  Advanced wear-and-tear arthritis (osteoarthritis).  Tendinitis.  Bursitis. Joint steroid injections may be repeated, but having them too often can damage a joint or the skin over the joint. You should not have joint steroid injections less than 6 weeks apart or more than four times a year. Tell a health care provider about:  Any allergies you have.  All medicines you are taking, including vitamins, herbs, eye drops, creams, and over-the-counter medicines.  Any problems you or family members have had with anesthetic medicines.  Any blood disorders you have.  Any surgeries you have had.  Any medical conditions you have.  Whether you are pregnant or may be pregnant. What are the risks? Generally, this is a safe treatment. However, problems may occur, including:  Infection.  Bleeding.  Allergic reactions to medicines.  Damage to the joint or tissues around the joint.  Thinning of skin or loss of skin color over the joint.  Temporary flushing of the face or chest.  Temporary increase in pain.  Temporary increase in blood sugar.  Failure to relieve inflammation or pain. What  happens before the treatment? Medicines Ask your health care provider about:  Changing or stopping your regular medicines. This is especially important if you are taking diabetes medicines or blood thinners.  Taking medicines such as aspirin and ibuprofen. These medicines can thin your blood. Do not take these medicines unless your health care provider tells you to take them.  Taking over-the-counter medicines, vitamins, herbs, and supplements. General instructions  You may have imaging tests of your joint.  Ask your health care provider if you can drive yourself home after the procedure. What happens during the treatment?  Your health care provider will position you for the injection and locate the injection site over your joint.  The skin over the joint will be cleaned with a germ-killing soap.  Your health care provider may: ? Spray a numbing solution (topical anesthetic) over the injection site. ? Inject a local anesthetic under the skin above your joint.  The needle will be placed through your skin into your joint. Your health care provider may use imaging to guide the needle to the right spot for the injection. If imaging is used, a special contrast dye may be injected to confirm that the needle is in the correct location.  The steroid medicine will be injected into your joint.  Anesthetic may be injected along with the steroid. This may be a medicine that relieves pain for a short time (short-acting anesthetic) or for a longer time (long-acting anesthetic).  The needle will be removed, and an adhesive bandage (dressing) will be placed over the injection site. The procedure may vary among health care providers and hospitals.     What can I expect after the treatment?  You will be able to go home after the treatment.  It is normal to feel slight flushing for a few days after the injection.  After the treatment, it is common to have an increase in joint pain after the  anesthetic has worn off. This may happen about an hour after a short-acting anesthetic or about 8 hours after a longer-acting anesthetic.  You should begin to feel relief from joint pain and swelling after 24 to 48 hours. Contact your health care provider if you do not begin to feel relief after 2 days. Follow these instructions at home: Injection site care  Leave the adhesive dressing over your injection site in place until your health care provider says you can remove it.  Check your injection site every day for signs of infection. Check for: ? More redness, swelling, or pain. ? Fluid or blood. ? Warmth. ? Pus or a bad smell. Activity  Return to your normal activities as told by your health care provider. Ask your health care provider what activities are safe for you. You may be asked to limit activities that put stress on the joint for a few days.  Do joint exercises as told by your health care provider.  Do not take baths, swim, or use a hot tub until your health care provider approves. Ask your health care provider if you may take showers. You may only be allowed to take sponge baths. Managing pain, stiffness, and swelling  If directed, put ice on the joint. To do this: ? Put ice in a plastic bag. ? Place a towel between your skin and the bag. ? Leave the ice on for 20 minutes, 2-3 times a day. ? Remove the ice if your skin turns bright red. This is very important. If you cannot feel pain, heat, or cold, you have a greater risk of damage to the area.  Raise (elevate) your joint above the level of your heart when you are sitting or lying down.   General instructions  Take over-the-counter and prescription medicines only as told by your health care provider.  Do not use any products that contain nicotine or tobacco, such as cigarettes, e-cigarettes, and chewing tobacco. These can delay joint healing. If you need help quitting, ask your health care provider.  If you have  diabetes, be aware that your blood sugar may be slightly elevated for several days after the injection.  Keep all follow-up visits. This is important. Contact a health care provider if you have:  Chills or a fever.  Any signs of infection at your injection site.  Increased pain or swelling or no relief after 2 days. Summary  A joint steroid injection is a treatment to relieve pain and swelling in a joint.  Steroids are medicines that reduce inflammation. Your health care provider may add an anesthetic along with the steroid.  You may have joint steroid injections as part of your arthritis treatment.  Joint steroid injections may be repeated, but having them too often can damage a joint or the skin over the joint.  Contact your health care provider if you have a fever, chills, or signs of infection, or if you get no relief from joint pain or swelling. This information is not intended to replace advice given to you by your health care provider. Make sure you discuss any questions you have with your health care provider. Document Revised: 11/02/2019 Document Reviewed: 11/02/2019 Elsevier Patient Education    2021 Elsevier Inc.  

## 2020-08-29 LAB — CBC WITH DIFFERENTIAL/PLATELET
Basophils Absolute: 0 10*3/uL (ref 0.0–0.2)
Basos: 0 %
EOS (ABSOLUTE): 0.1 10*3/uL (ref 0.0–0.4)
Eos: 1 %
Hematocrit: 51.4 % — ABNORMAL HIGH (ref 37.5–51.0)
Hemoglobin: 16.6 g/dL (ref 13.0–17.7)
Immature Grans (Abs): 0 10*3/uL (ref 0.0–0.1)
Immature Granulocytes: 0 %
Lymphocytes Absolute: 3 10*3/uL (ref 0.7–3.1)
Lymphs: 41 %
MCH: 29.1 pg (ref 26.6–33.0)
MCHC: 32.3 g/dL (ref 31.5–35.7)
MCV: 90 fL (ref 79–97)
Monocytes Absolute: 0.6 10*3/uL (ref 0.1–0.9)
Monocytes: 8 %
Neutrophils Absolute: 3.7 10*3/uL (ref 1.4–7.0)
Neutrophils: 50 %
Platelets: 276 10*3/uL (ref 150–450)
RBC: 5.7 x10E6/uL (ref 4.14–5.80)
RDW: 12.9 % (ref 11.6–15.4)
WBC: 7.4 10*3/uL (ref 3.4–10.8)

## 2020-08-29 LAB — LIPID PANEL
Chol/HDL Ratio: 3.5 ratio (ref 0.0–5.0)
Cholesterol, Total: 123 mg/dL (ref 100–199)
HDL: 35 mg/dL — ABNORMAL LOW (ref >39)
LDL Chol Calc (NIH): 44 mg/dL (ref 0–99)
Triglycerides: 286 mg/dL — ABNORMAL HIGH (ref 0–149)
VLDL Cholesterol Cal: 44 mg/dL — ABNORMAL HIGH (ref 5–40)

## 2020-08-29 LAB — CMP14+EGFR
ALT: 35 IU/L (ref 0–44)
AST: 28 IU/L (ref 0–40)
Albumin/Globulin Ratio: 2.2 (ref 1.2–2.2)
Albumin: 5 g/dL — ABNORMAL HIGH (ref 3.8–4.9)
Alkaline Phosphatase: 45 IU/L (ref 44–121)
BUN/Creatinine Ratio: 13 (ref 9–20)
BUN: 17 mg/dL (ref 6–24)
Bilirubin Total: 0.3 mg/dL (ref 0.0–1.2)
CO2: 20 mmol/L (ref 20–29)
Calcium: 9.9 mg/dL (ref 8.7–10.2)
Chloride: 101 mmol/L (ref 96–106)
Creatinine, Ser: 1.34 mg/dL — ABNORMAL HIGH (ref 0.76–1.27)
Globulin, Total: 2.3 g/dL (ref 1.5–4.5)
Glucose: 244 mg/dL — ABNORMAL HIGH (ref 65–99)
Potassium: 4.5 mmol/L (ref 3.5–5.2)
Sodium: 139 mmol/L (ref 134–144)
Total Protein: 7.3 g/dL (ref 6.0–8.5)
eGFR: 64 mL/min/{1.73_m2} (ref >59)

## 2020-09-25 DIAGNOSIS — S134XXA Sprain of ligaments of cervical spine, initial encounter: Secondary | ICD-10-CM | POA: Diagnosis not present

## 2020-09-25 DIAGNOSIS — S233XXA Sprain of ligaments of thoracic spine, initial encounter: Secondary | ICD-10-CM | POA: Diagnosis not present

## 2020-09-25 DIAGNOSIS — S43422A Sprain of left rotator cuff capsule, initial encounter: Secondary | ICD-10-CM | POA: Diagnosis not present

## 2020-10-02 DIAGNOSIS — S43422A Sprain of left rotator cuff capsule, initial encounter: Secondary | ICD-10-CM | POA: Diagnosis not present

## 2020-10-02 DIAGNOSIS — S233XXA Sprain of ligaments of thoracic spine, initial encounter: Secondary | ICD-10-CM | POA: Diagnosis not present

## 2020-10-02 DIAGNOSIS — S134XXA Sprain of ligaments of cervical spine, initial encounter: Secondary | ICD-10-CM | POA: Diagnosis not present

## 2020-10-08 DIAGNOSIS — S43422A Sprain of left rotator cuff capsule, initial encounter: Secondary | ICD-10-CM | POA: Diagnosis not present

## 2020-10-08 DIAGNOSIS — S233XXA Sprain of ligaments of thoracic spine, initial encounter: Secondary | ICD-10-CM | POA: Diagnosis not present

## 2020-10-08 DIAGNOSIS — S134XXA Sprain of ligaments of cervical spine, initial encounter: Secondary | ICD-10-CM | POA: Diagnosis not present

## 2020-10-22 DIAGNOSIS — S134XXA Sprain of ligaments of cervical spine, initial encounter: Secondary | ICD-10-CM | POA: Diagnosis not present

## 2020-10-22 DIAGNOSIS — S233XXA Sprain of ligaments of thoracic spine, initial encounter: Secondary | ICD-10-CM | POA: Diagnosis not present

## 2020-10-22 DIAGNOSIS — S43422A Sprain of left rotator cuff capsule, initial encounter: Secondary | ICD-10-CM | POA: Diagnosis not present

## 2020-10-30 DIAGNOSIS — S43422A Sprain of left rotator cuff capsule, initial encounter: Secondary | ICD-10-CM | POA: Diagnosis not present

## 2020-10-30 DIAGNOSIS — S233XXA Sprain of ligaments of thoracic spine, initial encounter: Secondary | ICD-10-CM | POA: Diagnosis not present

## 2020-10-30 DIAGNOSIS — S134XXA Sprain of ligaments of cervical spine, initial encounter: Secondary | ICD-10-CM | POA: Diagnosis not present

## 2020-11-05 DIAGNOSIS — S43422A Sprain of left rotator cuff capsule, initial encounter: Secondary | ICD-10-CM | POA: Diagnosis not present

## 2020-11-05 DIAGNOSIS — S233XXA Sprain of ligaments of thoracic spine, initial encounter: Secondary | ICD-10-CM | POA: Diagnosis not present

## 2020-11-05 DIAGNOSIS — S134XXA Sprain of ligaments of cervical spine, initial encounter: Secondary | ICD-10-CM | POA: Diagnosis not present

## 2020-11-17 DIAGNOSIS — S134XXA Sprain of ligaments of cervical spine, initial encounter: Secondary | ICD-10-CM | POA: Diagnosis not present

## 2020-11-17 DIAGNOSIS — S43422A Sprain of left rotator cuff capsule, initial encounter: Secondary | ICD-10-CM | POA: Diagnosis not present

## 2020-11-17 DIAGNOSIS — S233XXA Sprain of ligaments of thoracic spine, initial encounter: Secondary | ICD-10-CM | POA: Diagnosis not present

## 2020-11-26 ENCOUNTER — Ambulatory Visit: Payer: Self-pay

## 2020-11-26 ENCOUNTER — Ambulatory Visit: Payer: BC Managed Care – PPO | Admitting: Orthopaedic Surgery

## 2020-11-26 ENCOUNTER — Encounter: Payer: Self-pay | Admitting: Orthopaedic Surgery

## 2020-11-26 ENCOUNTER — Other Ambulatory Visit: Payer: Self-pay

## 2020-11-26 VITALS — Ht 71.0 in | Wt 225.0 lb

## 2020-11-26 DIAGNOSIS — M25512 Pain in left shoulder: Secondary | ICD-10-CM | POA: Insufficient documentation

## 2020-11-26 DIAGNOSIS — G8929 Other chronic pain: Secondary | ICD-10-CM | POA: Diagnosis not present

## 2020-11-26 NOTE — Progress Notes (Signed)
Office Visit Note   Patient: Steven Mcgee           Date of Birth: 1970-12-30           MRN: [id number] Visit Date: 11/26/2020              Requested by: Bennie Pierini, FNP [address] [place],  [place] [address] PCP: Bennie Pierini, FNP   Assessment & Plan: Visit Diagnoses:  1. Chronic left shoulder pain     Plan: Steven Mcgee is referred by Charissa Bash at [facility] chiropractor for evaluation of chronic left shoulder pain.  Steven Mcgee relates that he has had trouble for at least 4 to 5 years and appears to be worse with heavy activity.  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  He denies a specific injury or trauma but there is been certain events that have caused him to have more pain.  He recently was on a Ferris wheel and opening the door when he experienced pain that referred down the arm almost to the elbow.  He is not had any numbness or tingling or neck pain.  He has had some trouble with overhead activity.  Chiropractor treatment has not been successful and, thus, reason for the referral.  X-rays were negative.  He does have evidence of a mild adhesive capsulitis but does have positive impingement and might very well have some other pathology i.e. rotator cuff tear.  Given the chronicity of his problem will order an MRI arthrogram  Follow-Up Instructions: Return After MRI with contrast left shoulder.   Orders:  Orders Placed This Encounter  Procedures   XR Shoulder Left   Arthrogram   MR Shoulder Left w/ contrast   No orders of the defined types were placed in this encounter.     Procedures: No procedures performed   Clinical Data: No additional findings.   Subjective: Chief Complaint  Patient presents with   Left Shoulder - Pain  Patient presents today for left shoulder and upper arm pain. He said that it has been bothering him for a couple years. He has pain throughout his upper arm and into his  scapula. No numbness in his arm. Decreased range of motion. He has been seeing a [facility] in [place] and states that it is not improving. He drives a truck. Not taking anything for pain. He is right hand dominant. He has not had any previous shoulder surgery.  Type II diabetic  HPI  Review of Systems   Objective: Vital Signs: Ht 5\' 11"  (1.803 m)   Wt 225 lb (102.1 kg)   BMI 31.38 kg/m   Physical Exam Constitutional:      Appearance: He is well-developed.  Eyes:     Pupils: Pupils are equal, round, and reactive to light.  Pulmonary:     Effort: Pulmonary effort is normal.  Skin:    General: Skin is warm and dry.  Neurological:     Mental Status: He is alert and oriented to person, place, and time.  Psychiatric:        Behavior: Behavior normal.    Ortho Exam left shoulder with some loss of overhead motion and internal rotation.  Lacked about 30 degrees to full overhead motion compared to the right shoulder.  Positive impingement and empty can testing.  Strength appeared to be good good grip and release.  Neurologically intact.  No neck pain.  Some minimal anterior subacromial pain  Specialty Comments:  No specialty comments available.  Imaging: XR Shoulder Left  Result Date: 11/26/2020 Films of the left shoulder obtained in several projections.  No acute changes.  There is a lateral downsloping of the acromion and minimal degenerative changes at the [facility] joint.  Humeral head is centered in the glenoid.  Normal space between the humeral head and the acromion.  No obvious glenohumeral arthritis    PMFS History: Patient Active Problem List   Diagnosis Date Noted   Pain in left shoulder 11/26/2020   Hyperlipidemia associated with type 2 diabetes mellitus (HCC) 10/12/2018   BMI 32.0-32.9,adult 03/25/2016   Diabetes (HCC) 10/22/2013   Past Medical History:  Diagnosis Date   Diabetes mellitus without complication (HCC)    Hyperlipidemia    Hypertension     Family History   Problem Relation Age of Onset   Cancer Mother     Past Surgical History:  Procedure Laterality Date   APPENDECTOMY     Social History   Occupational History   Occupation: Truck driver  Tobacco Use   Smoking status: Never   Smokeless tobacco: Never  Vaping Use   Vaping Use: Never used  Substance and Sexual Activity   Alcohol use: Never   Drug use: Never   Sexual activity: Not on file

## 2020-11-27 DIAGNOSIS — S43422A Sprain of left rotator cuff capsule, initial encounter: Secondary | ICD-10-CM | POA: Diagnosis not present

## 2020-11-27 DIAGNOSIS — S134XXA Sprain of ligaments of cervical spine, initial encounter: Secondary | ICD-10-CM | POA: Diagnosis not present

## 2020-11-27 DIAGNOSIS — S233XXA Sprain of ligaments of thoracic spine, initial encounter: Secondary | ICD-10-CM | POA: Diagnosis not present

## 2020-12-02 ENCOUNTER — Telehealth: Payer: Self-pay | Admitting: Orthopaedic Surgery

## 2020-12-02 NOTE — Telephone Encounter (Signed)
Pt called wanting to make sure we got the prior autho. From his insurance company for an MRI he is supposed to be having. Pt would like a CB with an update.   [phone]

## 2020-12-03 ENCOUNTER — Ambulatory Visit: Payer: Self-pay | Admitting: Orthopaedic Surgery

## 2020-12-03 ENCOUNTER — Other Ambulatory Visit: Payer: Self-pay

## 2020-12-03 ENCOUNTER — Ambulatory Visit: Payer: BC Managed Care – PPO | Admitting: Orthopaedic Surgery

## 2020-12-04 NOTE — Telephone Encounter (Signed)
Pt is aware insurance has approved and order was sent to [facility] [place]  Pt states his appt is scheduled on July 15 at 930am.

## 2020-12-11 DIAGNOSIS — S43422A Sprain of left rotator cuff capsule, initial encounter: Secondary | ICD-10-CM | POA: Diagnosis not present

## 2020-12-11 DIAGNOSIS — S233XXA Sprain of ligaments of thoracic spine, initial encounter: Secondary | ICD-10-CM | POA: Diagnosis not present

## 2020-12-11 DIAGNOSIS — S134XXA Sprain of ligaments of cervical spine, initial encounter: Secondary | ICD-10-CM | POA: Diagnosis not present

## 2020-12-19 ENCOUNTER — Telehealth: Payer: Self-pay | Admitting: Orthopaedic Surgery

## 2020-12-19 DIAGNOSIS — M25512 Pain in left shoulder: Secondary | ICD-10-CM | POA: Diagnosis not present

## 2020-12-19 NOTE — Telephone Encounter (Signed)
Hospital called where pt went to get MRI and they couldn't get up good in joint because the pt was in a lot of pain so she said pt will not be charged and to let whitfield know he will not be getting a report.  She recommends maybe giving pt some pt pain medication to get a better picture.   Delma Post cb [phone] ext [phone]

## 2020-12-19 NOTE — Telephone Encounter (Signed)
Happy to call in pain med prior to rescheduling the MRI-let me know

## 2020-12-22 NOTE — Telephone Encounter (Signed)
I spoke with patient and Steven Mcgee at [facility]. Steven Mcgee states that Dr.Stroud was unable to get enough contrast in his shoulder, therefore the MRI was not adequate per Dr.D'Alessio and patient was not charged for it. Steven Mcgee suggested that Dr.Whitfield contact Dr.D'Alessio to see what his thoughts are or if he could make anything out before rescheduling patient.  Upon talking to patient, he was unaware of any of this and is not happy. He states that if it must be redone, he wants to come to [facility] and have it done. He does feel like a sedative or pain medicine may be beneficial.  I told patient that Dr.Whitfield is out of the office this week, but we would be touch with him after Dr.Whitfield reviews information.

## 2020-12-22 NOTE — Telephone Encounter (Signed)
Sounds like we need to reschedule at [facility] arthrogram left shoulder. Happy to call in sedative prior to the exam

## 2020-12-23 ENCOUNTER — Telehealth: Payer: Self-pay | Admitting: Orthopaedic Surgery

## 2020-12-23 NOTE — Telephone Encounter (Signed)
Pt returned called to Lauren G. Pt phone number is [phone].

## 2020-12-25 ENCOUNTER — Other Ambulatory Visit: Payer: Self-pay | Admitting: Nurse Practitioner

## 2020-12-25 DIAGNOSIS — E119 Type 2 diabetes mellitus without complications: Secondary | ICD-10-CM

## 2020-12-25 DIAGNOSIS — E1169 Type 2 diabetes mellitus with other specified complication: Secondary | ICD-10-CM

## 2020-12-26 NOTE — Telephone Encounter (Signed)
Order faxed to [facility] will contact pt to scheudle appt

## 2020-12-29 ENCOUNTER — Telehealth: Payer: Self-pay | Admitting: Orthopaedic Surgery

## 2020-12-29 NOTE — Telephone Encounter (Signed)
Pt called stating he was supposed to have a referral sent to [facility] and he hasn't heard anything. Pt would like our office to check on the referral and give him a CB with an Update.   [phone]

## 2020-12-29 NOTE — Telephone Encounter (Signed)
Can you advise? It looks like you sent order to [facility], however, I don't see it in Active Requests so I am unsure what to tell patient. Thanks.

## 2020-12-29 NOTE — Telephone Encounter (Signed)
You are correct I did send it to [facility], let him know he can also call them to schedule.

## 2020-12-29 NOTE — Telephone Encounter (Signed)
PT WAS SEEN ON 12/19/20 AT [facility]

## 2020-12-29 NOTE — Telephone Encounter (Signed)
He was, however, the study was not diagnostic and they did not charge patient. Per chart, he wants study at [facility]. It looks like you faxed order on 12/26/2020.

## 2020-12-30 ENCOUNTER — Telehealth: Payer: Self-pay | Admitting: Orthopaedic Surgery

## 2020-12-30 ENCOUNTER — Other Ambulatory Visit: Payer: Self-pay | Admitting: Orthopaedic Surgery

## 2020-12-30 ENCOUNTER — Telehealth: Payer: Self-pay | Admitting: Nurse Practitioner

## 2020-12-30 DIAGNOSIS — E119 Type 2 diabetes mellitus without complications: Secondary | ICD-10-CM

## 2020-12-30 MED ORDER — DAPAGLIFLOZIN PROPANEDIOL 10 MG PO TABS: 10.0000 mg | ORAL_TABLET | Freq: Every day | ORAL | 0 refills | Status: DC

## 2020-12-30 MED ORDER — DIAZEPAM 10 MG PO TABS: 10.0000 mg | ORAL_TABLET | Freq: Once | ORAL | 0 refills | Status: AC

## 2020-12-30 NOTE — Telephone Encounter (Signed)
Refill sent to pharmacy.   

## 2020-12-30 NOTE — Telephone Encounter (Signed)
Sent in valium 10mg  prior to MRI

## 2020-12-30 NOTE — Telephone Encounter (Signed)
  Prescription Request  12/30/2020  What is the name of the medication or equipment? Marcelline Deist   Have you contacted your pharmacy to request a refill? (if applicable) yes  Which pharmacy would you like this sent to? [facility]    Patient notified that their request is being sent to the clinical staff for review and that they should receive a response within 2 business days.

## 2020-12-30 NOTE — Telephone Encounter (Signed)
I called patient and advised. He did not have anything to write with. Number for [facility] scheduling emailed to [email or web address]

## 2020-12-30 NOTE — Telephone Encounter (Signed)
Patient called advised he is having his MRI 01/05/2021 and will need some medication called into his pharmacy prior to his appointment. [facility] [place]

## 2021-01-01 ENCOUNTER — Encounter: Payer: Self-pay | Admitting: Nurse Practitioner

## 2021-01-01 ENCOUNTER — Telehealth: Payer: Self-pay

## 2021-01-01 ENCOUNTER — Ambulatory Visit (INDEPENDENT_AMBULATORY_CARE_PROVIDER_SITE_OTHER): Payer: BC Managed Care – PPO | Admitting: Nurse Practitioner

## 2021-01-01 ENCOUNTER — Other Ambulatory Visit: Payer: Self-pay | Admitting: Orthopaedic Surgery

## 2021-01-01 ENCOUNTER — Other Ambulatory Visit: Payer: Self-pay

## 2021-01-01 ENCOUNTER — Other Ambulatory Visit: Payer: Self-pay | Admitting: Nurse Practitioner

## 2021-01-01 VITALS — BP 114/78 | HR 91 | Temp 98.1°F | Resp 20 | Ht 71.0 in | Wt 227.0 lb

## 2021-01-01 DIAGNOSIS — Z6832 Body mass index (BMI) 32.0-32.9, adult: Secondary | ICD-10-CM | POA: Diagnosis not present

## 2021-01-01 DIAGNOSIS — E785 Hyperlipidemia, unspecified: Secondary | ICD-10-CM | POA: Diagnosis not present

## 2021-01-01 DIAGNOSIS — R809 Proteinuria, unspecified: Secondary | ICD-10-CM

## 2021-01-01 DIAGNOSIS — E1169 Type 2 diabetes mellitus with other specified complication: Secondary | ICD-10-CM | POA: Diagnosis not present

## 2021-01-01 DIAGNOSIS — E119 Type 2 diabetes mellitus without complications: Secondary | ICD-10-CM | POA: Diagnosis not present

## 2021-01-01 LAB — BAYER DCA HB A1C WAIVED: HB A1C (BAYER DCA - WAIVED): 9 % — ABNORMAL HIGH (ref <7.0)

## 2021-01-01 MED ORDER — TRULICITY 1.5 MG/0.5ML ~~LOC~~ SOAJ: 1.5000 mg | SUBCUTANEOUS | 3 refills | Status: DC

## 2021-01-01 MED ORDER — METFORMIN HCL 1000 MG PO TABS: 1000.0000 mg | ORAL_TABLET | Freq: Two times a day (BID) | ORAL | 3 refills | Status: DC

## 2021-01-01 MED ORDER — DAPAGLIFLOZIN PROPANEDIOL 10 MG PO TABS: 10.0000 mg | ORAL_TABLET | Freq: Every day | ORAL | 1 refills | Status: DC

## 2021-01-01 MED ORDER — FENOFIBRATE 145 MG PO TABS: ORAL_TABLET | ORAL | 1 refills | Status: DC

## 2021-01-01 MED ORDER — DIAZEPAM 10 MG PO TABS: 10.0000 mg | ORAL_TABLET | Freq: Once | ORAL | 0 refills | Status: AC

## 2021-01-01 MED ORDER — GLIMEPIRIDE 4 MG PO TABS: ORAL_TABLET | ORAL | 1 refills | Status: DC

## 2021-01-01 MED ORDER — ROSUVASTATIN CALCIUM 10 MG PO TABS: 10.0000 mg | ORAL_TABLET | Freq: Every day | ORAL | 1 refills | Status: DC

## 2021-01-01 MED ORDER — LISINOPRIL 10 MG PO TABS: ORAL_TABLET | ORAL | 1 refills | Status: DC

## 2021-01-01 NOTE — Telephone Encounter (Signed)
Patient called regarding a rx for valium patient stated the pharmacy did not receive a rx from [place] he is requesting a rx for valium to be sent to [facility] - [place], [place] - [address] call back:[phone]

## 2021-01-01 NOTE — Progress Notes (Signed)
Subjective:    Patient ID: Steven Mcgee, male    DOB: [birth year], 50 y.o.   MRN: [id number]   Chief Complaint: medical management of chronic issues     HPI:  1. Hyperlipidemia associated with type 2 diabetes mellitus ([place]) Tries to watch diet but doe sno dedicated exercise. Lab Results  Component Value Date   CHOL 123 08/28/2020   HDL 35 (L) 08/28/2020   LDLCALC 44 08/28/2020   TRIG 286 (H) 08/28/2020   CHOLHDL 3.5 08/28/2020     2. Type 2 diabetes mellitus without complication, without long-term current use of insulin ([place]) He refuses to checks his blood sugars. He has been trying to watch diet. Lab Results  Component Value Date   HGBA1C 7.9 (H) 08/28/2020     3. BMI 32.0-32.9,adult No recent weight changes Wt Readings from Last 3 Encounters:  01/01/21 227 lb (103 kg)  11/26/20 225 lb (102.1 kg)  08/28/20 223 lb (101.2 kg)   BMI Readings from Last 3 Encounters:  01/01/21 31.66 kg/m  11/26/20 31.38 kg/m  08/28/20 31.10 kg/m       Outpatient Encounter Medications as of 01/01/2021  Medication Sig   dapagliflozin propanediol (FARXIGA) 10 MG TABS tablet Take 1 tablet (10 mg total) by mouth daily.   Dulaglutide (TRULICITY) 4.09 WJ/1.9JY SOPN Inject 0.75 mg into the skin once a week.   fenofibrate (TRICOR) 145 MG tablet TAKE 1 TABLET(145 MG) BY MOUTH DAILY   glimepiride (AMARYL) 4 MG tablet TAKE ONE TABLET BY MOUTH IN THE MORNING AND AT BEDTIME   lisinopril (ZESTRIL) 10 MG tablet TAKE 1 TABLET(10 MG) BY MOUTH DAILY   metFORMIN (GLUCOPHAGE) 1000 MG tablet Take 1 tablet (1,000 mg total) by mouth 2 (two) times daily with a meal.   rosuvastatin (CRESTOR) 10 MG tablet TAKE ONE TABLET BY MOUTH EVERY DAY   No facility-administered encounter medications on file as of 01/01/2021.    Past Surgical History:  Procedure Laterality Date   APPENDECTOMY      Family History  Problem Relation Age of Onset   Cancer Mother     New complaints: Has been having left  shoulder pain. Is seeing specialist and waiting for MRI results,  Social history: Lives by hisself  Controlled substance contract: n/a     Review of Systems  Constitutional:  Negative for diaphoresis.  Eyes:  Negative for pain.  Respiratory:  Negative for shortness of breath.   Cardiovascular:  Negative for chest pain, palpitations and leg swelling.  Gastrointestinal:  Negative for abdominal pain.  Endocrine: Negative for polydipsia.  Skin:  Negative for rash.  Neurological:  Negative for dizziness, weakness and headaches.  Hematological:  Does not bruise/bleed easily.  All other systems reviewed and are negative.     Objective:   Physical Exam Vitals and nursing note reviewed.  Constitutional:      Appearance: Normal appearance. He is well-developed.  HENT:     Head: Normocephalic.     Nose: Nose normal.  Eyes:     Pupils: Pupils are equal, round, and reactive to light.  Neck:     Thyroid: No thyroid mass or thyromegaly.     Vascular: No carotid bruit or JVD.     Trachea: Phonation normal.  Cardiovascular:     Rate and Rhythm: Normal rate and regular rhythm.  Pulmonary:     Effort: Pulmonary effort is normal. No respiratory distress.     Breath sounds: Normal breath sounds.  Abdominal:  General: Bowel sounds are normal.     Palpations: Abdomen is soft.     Tenderness: There is no abdominal tenderness.  Musculoskeletal:        General: Normal range of motion.     Cervical back: Normal range of motion and neck supple.  Lymphadenopathy:     Cervical: No cervical adenopathy.  Skin:    General: Skin is warm and dry.  Neurological:     Mental Status: He is alert and oriented to person, place, and time.  Psychiatric:        Behavior: Behavior normal.        Thought Content: Thought content normal.        Judgment: Judgment normal.    BP 114/78   Pulse 91   Temp 98.1 F (36.7 C) (Temporal)   Resp 20   Ht 5' 11"  (1.803 m)   Wt 227 lb (103 kg)   SpO2 95%    BMI 31.66 kg/m   HGBA1c 90%     Assessment & Plan:   Steven Mcgee comes in today with chief complaint of Medical Management of Chronic Issues   Diagnosis and orders addressed:  1. Hyperlipidemia associated with type 2 diabetes mellitus (HCC) Low fat diet - Lipid panel - fenofibrate (TRICOR) 145 MG tablet; TAKE 1 TABLET(145 MG) BY MOUTH DAILY  Dispense: 90 tablet; Refill: 1 - rosuvastatin (CRESTOR) 10 MG tablet; Take 1 tablet (10 mg total) by mouth daily.  Dispense: 90 tablet; Refill: 1  2. Type 2 diabetes mellitus without complication, without long-term current use of insulin (HCC) Strciter carb coutning Increased trulicity to 0.2VO weekly - Bayer DCA Hb A1c Waived - CBC with Differential/Platelet - CMP14+EGFR - metFORMIN (GLUCOPHAGE) 1000 MG tablet; Take 1 tablet (1,000 mg total) by mouth 2 (two) times daily with a meal.  Dispense: 180 tablet; Refill: 3 - glimepiride (AMARYL) 4 MG tablet; TAKE ONE TABLET BY MOUTH IN THE MORNING AND AT BEDTIME  Dispense: 180 tablet; Refill: 1 - dapagliflozin propanediol (FARXIGA) 10 MG TABS tablet; Take 1 tablet (10 mg total) by mouth daily.  Dispense: 90 tablet; Refill: 1  3. BMI 32.0-32.9,adult Discussed diet and exercise for person with BMI >25 Will recheck weight in 3-6 months  4. Microalbuminuria - lisinopril (ZESTRIL) 10 MG tablet; TAKE 1 TABLET(10 MG) BY MOUTH DAILY  Dispense: 90 tablet; Refill: 1   Labs pending Health Maintenance reviewed Diet and exercise encouraged  Follow up plan: 3 months  [name] Hassell Done, FNP

## 2021-01-01 NOTE — Telephone Encounter (Signed)
Tried to call patient. No answer. Left message that it has been sent to [facility] in [place]. If we need to send it elsewhere, I ask that he let us know.

## 2021-01-01 NOTE — Telephone Encounter (Signed)
resent

## 2021-01-01 NOTE — Telephone Encounter (Signed)
Needs to be resubmitted to [facility] in [place]. Thanks!

## 2021-01-01 NOTE — Telephone Encounter (Signed)
Called and notified patient.

## 2021-01-02 LAB — LIPID PANEL
Chol/HDL Ratio: 3.8 ratio (ref 0.0–5.0)
Cholesterol, Total: 129 mg/dL (ref 100–199)
HDL: 34 mg/dL — ABNORMAL LOW (ref >39)
LDL Chol Calc (NIH): 55 mg/dL (ref 0–99)
Triglycerides: 253 mg/dL — ABNORMAL HIGH (ref 0–149)
VLDL Cholesterol Cal: 40 mg/dL (ref 5–40)

## 2021-01-02 LAB — CBC WITH DIFFERENTIAL/PLATELET
Basophils Absolute: 0 10*3/uL (ref 0.0–0.2)
Basos: 0 %
EOS (ABSOLUTE): 0.1 10*3/uL (ref 0.0–0.4)
Eos: 1 %
Hematocrit: 48.3 % (ref 37.5–51.0)
Hemoglobin: 15.5 g/dL (ref 13.0–17.7)
Immature Grans (Abs): 0 10*3/uL (ref 0.0–0.1)
Immature Granulocytes: 0 %
Lymphocytes Absolute: 2.3 10*3/uL (ref 0.7–3.1)
Lymphs: 41 %
MCH: 28.8 pg (ref 26.6–33.0)
MCHC: 32.1 g/dL (ref 31.5–35.7)
MCV: 90 fL (ref 79–97)
Monocytes Absolute: 0.5 10*3/uL (ref 0.1–0.9)
Monocytes: 8 %
Neutrophils Absolute: 2.7 10*3/uL (ref 1.4–7.0)
Neutrophils: 50 %
Platelets: 264 10*3/uL (ref 150–450)
RBC: 5.39 x10E6/uL (ref 4.14–5.80)
RDW: 12.7 % (ref 11.6–15.4)
WBC: 5.5 10*3/uL (ref 3.4–10.8)

## 2021-01-02 LAB — CMP14+EGFR
ALT: 24 IU/L (ref 0–44)
AST: 13 IU/L (ref 0–40)
Albumin/Globulin Ratio: 2.1 (ref 1.2–2.2)
Albumin: 4.8 g/dL (ref 3.8–4.9)
Alkaline Phosphatase: 41 IU/L — ABNORMAL LOW (ref 44–121)
BUN/Creatinine Ratio: 13 (ref 9–20)
BUN: 15 mg/dL (ref 6–24)
Bilirubin Total: 0.3 mg/dL (ref 0.0–1.2)
CO2: 23 mmol/L (ref 20–29)
Calcium: 9.5 mg/dL (ref 8.7–10.2)
Chloride: 98 mmol/L (ref 96–106)
Creatinine, Ser: 1.17 mg/dL (ref 0.76–1.27)
Globulin, Total: 2.3 g/dL (ref 1.5–4.5)
Glucose: 279 mg/dL — ABNORMAL HIGH (ref 65–99)
Potassium: 4.2 mmol/L (ref 3.5–5.2)
Sodium: 138 mmol/L (ref 134–144)
Total Protein: 7.1 g/dL (ref 6.0–8.5)
eGFR: 75 mL/min/{1.73_m2} (ref >59)

## 2021-01-05 ENCOUNTER — Inpatient Hospital Stay: Admission: RE | Admit: 2021-01-05 | Payer: BC Managed Care – PPO | Source: Ambulatory Visit

## 2021-01-05 ENCOUNTER — Other Ambulatory Visit: Payer: BC Managed Care – PPO

## 2021-01-06 ENCOUNTER — Telehealth: Payer: Self-pay

## 2021-01-06 ENCOUNTER — Telehealth: Payer: Self-pay | Admitting: *Deleted

## 2021-01-06 NOTE — Telephone Encounter (Signed)
I called and sw representative and she doesn't know what this was about, says the case has been closed and can start a new case online.

## 2021-01-06 NOTE — Telephone Encounter (Signed)
Farxiga 10MG  tablets PA came in   rejected and requires prior authorization for coverage Key:  Additional Information Required Available without authorization.  AVAILABLE FILL DATE 2021/01/12

## 2021-01-06 NOTE — Telephone Encounter (Signed)
[facility] called regarding pts MRI.   They stated they need to know why pt needs to obtain this MRI.  They stated lauren needs to call and give verbal orders to make this correction.   CB # [phone]

## 2021-01-21 ENCOUNTER — Telehealth: Payer: Self-pay | Admitting: Nurse Practitioner

## 2021-01-21 NOTE — Telephone Encounter (Signed)
Patient needs to be seen for rash. He may see anyone but [place]

## 2021-01-21 NOTE — Telephone Encounter (Signed)
  Prescription Request  01/21/2021  What is the name of the medication or equipment? Pill for rash Ket?... or Kec?...  Have you contacted your pharmacy to request a refill? (if applicable) no  Which pharmacy would you like this sent to? [facility]   Patient notified that their request is being sent to the clinical staff for review and that they should receive a response within 2 business days.    MMM's pt.  He just seen MMM about couple weeks ago.  She usually calls this med in, when he has a flare-up.

## 2021-01-21 NOTE — Telephone Encounter (Signed)
Pt aware. He will call back to make an apt.

## 2021-01-22 ENCOUNTER — Other Ambulatory Visit: Payer: Self-pay

## 2021-01-22 ENCOUNTER — Ambulatory Visit
Admission: RE | Admit: 2021-01-22 | Discharge: 2021-01-22 | Disposition: A | Discharge status: Home / Self Care | Payer: BC Managed Care – PPO | Source: Ambulatory Visit | Attending: Orthopaedic Surgery | Admitting: Orthopaedic Surgery

## 2021-01-22 DIAGNOSIS — M25512 Pain in left shoulder: Secondary | ICD-10-CM

## 2021-01-22 DIAGNOSIS — G8929 Other chronic pain: Secondary | ICD-10-CM

## 2021-01-22 DIAGNOSIS — M19012 Primary osteoarthritis, left shoulder: Secondary | ICD-10-CM | POA: Diagnosis not present

## 2021-01-22 DIAGNOSIS — M25612 Stiffness of left shoulder, not elsewhere classified: Secondary | ICD-10-CM | POA: Diagnosis not present

## 2021-01-22 MED ORDER — IOPAMIDOL (ISOVUE-M 200) INJECTION 41%
15.0000 mL | Freq: Once | INTRAMUSCULAR | Status: AC
  Administered 2021-01-22: 15 mL via INTRA_ARTICULAR

## 2021-01-27 ENCOUNTER — Telehealth: Payer: Self-pay | Admitting: Orthopaedic Surgery

## 2021-01-27 NOTE — Telephone Encounter (Signed)
Pt is wondering the results of his mri?   CB [phone]

## 2021-01-28 ENCOUNTER — Other Ambulatory Visit: Payer: Self-pay

## 2021-01-28 DIAGNOSIS — M25512 Pain in left shoulder: Secondary | ICD-10-CM

## 2021-01-28 DIAGNOSIS — G8929 Other chronic pain: Secondary | ICD-10-CM

## 2021-01-28 NOTE — Telephone Encounter (Signed)
Called results of MRI- pt would like to try PT at [facility] in [place] arrange

## 2021-01-28 NOTE — Telephone Encounter (Signed)
Order made. They will call patient to schedule.  

## 2021-02-04 DIAGNOSIS — M7502 Adhesive capsulitis of left shoulder: Secondary | ICD-10-CM | POA: Diagnosis not present

## 2021-02-04 DIAGNOSIS — M25512 Pain in left shoulder: Secondary | ICD-10-CM | POA: Diagnosis not present

## 2021-02-10 DIAGNOSIS — M7502 Adhesive capsulitis of left shoulder: Secondary | ICD-10-CM | POA: Diagnosis not present

## 2021-02-10 DIAGNOSIS — M25512 Pain in left shoulder: Secondary | ICD-10-CM | POA: Diagnosis not present

## 2021-02-11 DIAGNOSIS — M25512 Pain in left shoulder: Secondary | ICD-10-CM | POA: Diagnosis not present

## 2021-02-11 DIAGNOSIS — M7502 Adhesive capsulitis of left shoulder: Secondary | ICD-10-CM | POA: Diagnosis not present

## 2021-02-16 DIAGNOSIS — M25512 Pain in left shoulder: Secondary | ICD-10-CM | POA: Diagnosis not present

## 2021-02-16 DIAGNOSIS — M7502 Adhesive capsulitis of left shoulder: Secondary | ICD-10-CM | POA: Diagnosis not present

## 2021-02-19 DIAGNOSIS — M25512 Pain in left shoulder: Secondary | ICD-10-CM | POA: Diagnosis not present

## 2021-02-19 DIAGNOSIS — M7502 Adhesive capsulitis of left shoulder: Secondary | ICD-10-CM | POA: Diagnosis not present

## 2021-02-23 DIAGNOSIS — M7502 Adhesive capsulitis of left shoulder: Secondary | ICD-10-CM | POA: Diagnosis not present

## 2021-02-23 DIAGNOSIS — M25512 Pain in left shoulder: Secondary | ICD-10-CM | POA: Diagnosis not present

## 2021-02-26 DIAGNOSIS — M7502 Adhesive capsulitis of left shoulder: Secondary | ICD-10-CM | POA: Diagnosis not present

## 2021-02-26 DIAGNOSIS — M25512 Pain in left shoulder: Secondary | ICD-10-CM | POA: Diagnosis not present

## 2021-03-04 DIAGNOSIS — M25512 Pain in left shoulder: Secondary | ICD-10-CM | POA: Diagnosis not present

## 2021-03-04 DIAGNOSIS — M7502 Adhesive capsulitis of left shoulder: Secondary | ICD-10-CM | POA: Diagnosis not present

## 2021-03-09 DIAGNOSIS — M25512 Pain in left shoulder: Secondary | ICD-10-CM | POA: Diagnosis not present

## 2021-03-09 DIAGNOSIS — M7502 Adhesive capsulitis of left shoulder: Secondary | ICD-10-CM | POA: Diagnosis not present

## 2021-03-12 DIAGNOSIS — M7502 Adhesive capsulitis of left shoulder: Secondary | ICD-10-CM | POA: Diagnosis not present

## 2021-03-12 DIAGNOSIS — M25512 Pain in left shoulder: Secondary | ICD-10-CM | POA: Diagnosis not present

## 2021-03-16 DIAGNOSIS — M25512 Pain in left shoulder: Secondary | ICD-10-CM | POA: Diagnosis not present

## 2021-03-16 DIAGNOSIS — M7502 Adhesive capsulitis of left shoulder: Secondary | ICD-10-CM | POA: Diagnosis not present

## 2021-03-19 DIAGNOSIS — M25512 Pain in left shoulder: Secondary | ICD-10-CM | POA: Diagnosis not present

## 2021-03-19 DIAGNOSIS — M7502 Adhesive capsulitis of left shoulder: Secondary | ICD-10-CM | POA: Diagnosis not present

## 2021-03-23 DIAGNOSIS — M25512 Pain in left shoulder: Secondary | ICD-10-CM | POA: Diagnosis not present

## 2021-03-23 DIAGNOSIS — M7502 Adhesive capsulitis of left shoulder: Secondary | ICD-10-CM | POA: Diagnosis not present

## 2021-03-26 DIAGNOSIS — M7502 Adhesive capsulitis of left shoulder: Secondary | ICD-10-CM | POA: Diagnosis not present

## 2021-03-26 DIAGNOSIS — M25512 Pain in left shoulder: Secondary | ICD-10-CM | POA: Diagnosis not present

## 2021-03-28 ENCOUNTER — Other Ambulatory Visit: Payer: Self-pay | Admitting: Nurse Practitioner

## 2021-03-30 DIAGNOSIS — M25512 Pain in left shoulder: Secondary | ICD-10-CM | POA: Diagnosis not present

## 2021-03-30 DIAGNOSIS — M7502 Adhesive capsulitis of left shoulder: Secondary | ICD-10-CM | POA: Diagnosis not present

## 2021-04-02 DIAGNOSIS — M25512 Pain in left shoulder: Secondary | ICD-10-CM | POA: Diagnosis not present

## 2021-04-02 DIAGNOSIS — M7502 Adhesive capsulitis of left shoulder: Secondary | ICD-10-CM | POA: Diagnosis not present

## 2021-04-03 ENCOUNTER — Other Ambulatory Visit: Payer: Self-pay

## 2021-04-03 ENCOUNTER — Ambulatory Visit: Payer: BC Managed Care – PPO | Admitting: Nurse Practitioner

## 2021-04-03 ENCOUNTER — Encounter: Payer: Self-pay | Admitting: Nurse Practitioner

## 2021-04-03 VITALS — BP 116/77 | HR 82 | Temp 98.1°F | Resp 20 | Ht 71.0 in | Wt 226.0 lb

## 2021-04-03 DIAGNOSIS — E785 Hyperlipidemia, unspecified: Secondary | ICD-10-CM

## 2021-04-03 DIAGNOSIS — E119 Type 2 diabetes mellitus without complications: Secondary | ICD-10-CM | POA: Diagnosis not present

## 2021-04-03 DIAGNOSIS — Z6832 Body mass index (BMI) 32.0-32.9, adult: Secondary | ICD-10-CM | POA: Diagnosis not present

## 2021-04-03 DIAGNOSIS — E1169 Type 2 diabetes mellitus with other specified complication: Secondary | ICD-10-CM

## 2021-04-03 LAB — BAYER DCA HB A1C WAIVED: HB A1C (BAYER DCA - WAIVED): 8.4 % — ABNORMAL HIGH (ref 4.8–5.6)

## 2021-04-03 MED ORDER — TRULICITY 1.5 MG/0.5ML ~~LOC~~ SOAJ: SUBCUTANEOUS | 5 refills | Status: DC

## 2021-04-03 MED ORDER — KETOCONAZOLE 200 MG PO TABS: 200.0000 mg | ORAL_TABLET | Freq: Every day | ORAL | 1 refills | Status: AC

## 2021-04-03 NOTE — Progress Notes (Signed)
Subjective:    Patient ID: Steven Mcgee, male    DOB: 1970-12-14, 50 y.o.   MRN: [id number]   Chief Complaint: medical management of chronic issues     HPI:  1. Hyperlipidemia associated with type 2 diabetes mellitus ([place]) He has never been one to watch his diet. Doe sno dedicated exercise. Lab Results  Component Value Date   CHOL 129 01/01/2021   HDL 34 (L) 01/01/2021   LDLCALC 55 01/01/2021   TRIG 253 (H) 01/01/2021   CHOLHDL 3.8 01/01/2021     2. Type 2 diabetes mellitus without complication, without long-term current use of insulin (HCC) Does not check his blood sugars often and does not watch diet. Says he feels good most of the time. We increased his trulicity at last visit. Lab Results  Component Value Date   HGBA1C 9.0 (H) 01/01/2021     3. BMI 32.0-32.9,adult No recent weight changes  Wt Readings from Last 3 Encounters:  04/03/21 226 lb (102.5 kg)  01/01/21 227 lb (103 kg)  11/26/20 225 lb (102.1 kg)   BMI Readings from Last 3 Encounters:  04/03/21 31.52 kg/m  01/01/21 31.66 kg/m  11/26/20 31.38 kg/m      Outpatient Encounter Medications as of 04/03/2021  Medication Sig   dapagliflozin propanediol (FARXIGA) 10 MG TABS tablet Take 1 tablet (10 mg total) by mouth daily.   fenofibrate (TRICOR) 145 MG tablet TAKE 1 TABLET(145 MG) BY MOUTH DAILY   glimepiride (AMARYL) 4 MG tablet TAKE ONE TABLET BY MOUTH IN THE MORNING AND AT BEDTIME   lisinopril (ZESTRIL) 10 MG tablet TAKE 1 TABLET(10 MG) BY MOUTH DAILY   metFORMIN (GLUCOPHAGE) 1000 MG tablet Take 1 tablet (1,000 mg total) by mouth 2 (two) times daily with a meal.   rosuvastatin (CRESTOR) 10 MG tablet Take 1 tablet (10 mg total) by mouth daily.   TRULICITY 1.5 PI/9.5JO SOPN INJECT 1.5MG INTO THE SKIN ONCE A WEEK   No facility-administered encounter medications on file as of 04/03/2021.    Past Surgical History:  Procedure Laterality Date   APPENDECTOMY      Family History  Problem  Relation Age of Onset   Cancer Mother     New complaints: None today  Social history: Lives by hisself  Controlled substance contract: n/a     Review of Systems  Constitutional:  Negative for diaphoresis.  Eyes:  Negative for pain.  Respiratory:  Negative for shortness of breath.   Cardiovascular:  Negative for chest pain, palpitations and leg swelling.  Gastrointestinal:  Negative for abdominal pain.  Endocrine: Negative for polydipsia.  Skin:  Negative for rash.  Neurological:  Negative for dizziness, weakness and headaches.  Hematological:  Does not bruise/bleed easily.  All other systems reviewed and are negative.     Objective:   Physical Exam Vitals and nursing note reviewed.  Constitutional:      Appearance: Normal appearance. He is well-developed.  HENT:     Head: Normocephalic.     Nose: Nose normal.  Eyes:     Pupils: Pupils are equal, round, and reactive to light.  Neck:     Thyroid: No thyroid mass or thyromegaly.     Vascular: No carotid bruit or JVD.     Trachea: Phonation normal.  Cardiovascular:     Rate and Rhythm: Normal rate and regular rhythm.  Pulmonary:     Effort: Pulmonary effort is normal. No respiratory distress.     Breath sounds: Normal breath sounds.  Abdominal:     General: Bowel sounds are normal.     Palpations: Abdomen is soft.     Tenderness: There is no abdominal tenderness.  Musculoskeletal:        General: Normal range of motion.     Cervical back: Normal range of motion and neck supple.  Lymphadenopathy:     Cervical: No cervical adenopathy.  Skin:    General: Skin is warm and dry.  Neurological:     Mental Status: He is alert and oriented to person, place, and time.  Psychiatric:        Behavior: Behavior normal.        Thought Content: Thought content normal.        Judgment: Judgment normal.   BP 116/77   Pulse 82   Temp 98.1 F (36.7 C) (Temporal)   Resp 20   Ht 5' 11"  (1.803 m)   Wt 226 lb (102.5 kg)    SpO2 97%   BMI 31.52 kg/m   HGBa1c 8.3%       Assessment & Plan:  Jermale Crass comes in today with chief complaint of Medical Management of Chronic Issues   Diagnosis and orders addressed:  1. Hyperlipidemia associated with type 2 diabetes mellitus (HCC) Low fat diet - CMP14+EGFR - CBC with Differential/Platelet - Lipid panel  2. Type 2 diabetes mellitus without complication, without long-term current use of insulin (HCC) Watch carbsin diet - Bayer DCA Hb A1c Waived - Dulaglutide (TRULICITY) 1.5 ON/6.2XB SOPN; INJECT 1.5MG INTO THE SKIN ONCE A WEEK  Dispense: 2 mL; Refill: 5  3. BMI 32.0-32.9,adult Discussed diet and exercise for person with BMI >25 Will recheck weight in 3-6 months    Labs pending Health Maintenance reviewed Diet and exercise encouraged  Follow up plan: 3 months   [name] Hassell Done, FNP

## 2021-04-03 NOTE — Patient Instructions (Signed)

## 2021-04-04 LAB — CBC WITH DIFFERENTIAL/PLATELET
Basophils Absolute: 0 10*3/uL (ref 0.0–0.2)
Basos: 1 %
EOS (ABSOLUTE): 0.1 10*3/uL (ref 0.0–0.4)
Eos: 1 %
Hematocrit: 45.1 % (ref 37.5–51.0)
Hemoglobin: 15 g/dL (ref 13.0–17.7)
Immature Grans (Abs): 0 10*3/uL (ref 0.0–0.1)
Immature Granulocytes: 0 %
Lymphocytes Absolute: 2.3 10*3/uL (ref 0.7–3.1)
Lymphs: 37 %
MCH: 29.8 pg (ref 26.6–33.0)
MCHC: 33.3 g/dL (ref 31.5–35.7)
MCV: 90 fL (ref 79–97)
Monocytes Absolute: 0.5 10*3/uL (ref 0.1–0.9)
Monocytes: 8 %
Neutrophils Absolute: 3.3 10*3/uL (ref 1.4–7.0)
Neutrophils: 53 %
Platelets: 300 10*3/uL (ref 150–450)
RBC: 5.03 x10E6/uL (ref 4.14–5.80)
RDW: 12.9 % (ref 11.6–15.4)
WBC: 6.1 10*3/uL (ref 3.4–10.8)

## 2021-04-04 LAB — CMP14+EGFR
ALT: 32 IU/L (ref 0–44)
AST: 26 IU/L (ref 0–40)
Albumin/Globulin Ratio: 2.2 (ref 1.2–2.2)
Albumin: 5 g/dL — ABNORMAL HIGH (ref 3.8–4.9)
Alkaline Phosphatase: 42 IU/L — ABNORMAL LOW (ref 44–121)
BUN/Creatinine Ratio: 13 (ref 9–20)
BUN: 16 mg/dL (ref 6–24)
Bilirubin Total: 0.4 mg/dL (ref 0.0–1.2)
CO2: 23 mmol/L (ref 20–29)
Calcium: 9.6 mg/dL (ref 8.7–10.2)
Chloride: 100 mmol/L (ref 96–106)
Creatinine, Ser: 1.26 mg/dL (ref 0.76–1.27)
Globulin, Total: 2.3 g/dL (ref 1.5–4.5)
Glucose: 248 mg/dL — ABNORMAL HIGH (ref 70–99)
Potassium: 4.6 mmol/L (ref 3.5–5.2)
Sodium: 137 mmol/L (ref 134–144)
Total Protein: 7.3 g/dL (ref 6.0–8.5)
eGFR: 69 mL/min/{1.73_m2} (ref >59)

## 2021-04-04 LAB — LIPID PANEL
Chol/HDL Ratio: 3.6 ratio (ref 0.0–5.0)
Cholesterol, Total: 119 mg/dL (ref 100–199)
HDL: 33 mg/dL — ABNORMAL LOW (ref >39)
LDL Chol Calc (NIH): 35 mg/dL (ref 0–99)
Triglycerides: 345 mg/dL — ABNORMAL HIGH (ref 0–149)
VLDL Cholesterol Cal: 51 mg/dL — ABNORMAL HIGH (ref 5–40)

## 2021-04-06 ENCOUNTER — Other Ambulatory Visit: Payer: Self-pay | Admitting: Nurse Practitioner

## 2021-04-06 DIAGNOSIS — M7502 Adhesive capsulitis of left shoulder: Secondary | ICD-10-CM | POA: Diagnosis not present

## 2021-04-06 DIAGNOSIS — E1169 Type 2 diabetes mellitus with other specified complication: Secondary | ICD-10-CM

## 2021-04-06 DIAGNOSIS — M25512 Pain in left shoulder: Secondary | ICD-10-CM | POA: Diagnosis not present

## 2021-04-08 DIAGNOSIS — M25512 Pain in left shoulder: Secondary | ICD-10-CM | POA: Diagnosis not present

## 2021-04-08 DIAGNOSIS — M7502 Adhesive capsulitis of left shoulder: Secondary | ICD-10-CM | POA: Diagnosis not present

## 2021-04-16 DIAGNOSIS — M7502 Adhesive capsulitis of left shoulder: Secondary | ICD-10-CM | POA: Diagnosis not present

## 2021-04-16 DIAGNOSIS — M25512 Pain in left shoulder: Secondary | ICD-10-CM | POA: Diagnosis not present

## 2021-04-17 ENCOUNTER — Ambulatory Visit (INDEPENDENT_AMBULATORY_CARE_PROVIDER_SITE_OTHER): Payer: Self-pay | Admitting: Nurse Practitioner

## 2021-04-17 ENCOUNTER — Other Ambulatory Visit: Payer: Self-pay

## 2021-04-17 ENCOUNTER — Encounter: Payer: Self-pay | Admitting: Nurse Practitioner

## 2021-04-17 DIAGNOSIS — Z024 Encounter for examination for driving license: Secondary | ICD-10-CM

## 2021-04-17 LAB — URINALYSIS
Bilirubin, UA: NEGATIVE
Ketones, UA: NEGATIVE
Leukocytes,UA: NEGATIVE
Nitrite, UA: NEGATIVE
Protein,UA: NEGATIVE
RBC, UA: NEGATIVE
Specific Gravity, UA: 1.015 (ref 1.005–1.030)
Urobilinogen, Ur: 0.2 mg/dL (ref 0.2–1.0)
pH, UA: 5.5 (ref 5.0–7.5)

## 2021-04-17 NOTE — Progress Notes (Signed)
[employer]- see scanned document 

## 2021-04-20 ENCOUNTER — Ambulatory Visit: Payer: BC Managed Care – PPO | Admitting: Nurse Practitioner

## 2021-04-21 DIAGNOSIS — M7502 Adhesive capsulitis of left shoulder: Secondary | ICD-10-CM | POA: Diagnosis not present

## 2021-04-21 DIAGNOSIS — M25512 Pain in left shoulder: Secondary | ICD-10-CM | POA: Diagnosis not present

## 2021-04-23 DIAGNOSIS — M7502 Adhesive capsulitis of left shoulder: Secondary | ICD-10-CM | POA: Diagnosis not present

## 2021-04-23 DIAGNOSIS — M25512 Pain in left shoulder: Secondary | ICD-10-CM | POA: Diagnosis not present

## 2021-05-19 ENCOUNTER — Other Ambulatory Visit: Payer: Self-pay | Admitting: Nurse Practitioner

## 2021-05-19 DIAGNOSIS — R809 Proteinuria, unspecified: Secondary | ICD-10-CM

## 2021-06-26 ENCOUNTER — Ambulatory Visit: Payer: BC Managed Care – PPO | Admitting: Nurse Practitioner

## 2021-06-26 ENCOUNTER — Encounter: Payer: Self-pay | Admitting: Nurse Practitioner

## 2021-06-26 VITALS — BP 116/77 | HR 90 | Temp 98.1°F | Resp 20 | Ht 71.0 in | Wt 221.0 lb

## 2021-06-26 DIAGNOSIS — E1169 Type 2 diabetes mellitus with other specified complication: Secondary | ICD-10-CM | POA: Diagnosis not present

## 2021-06-26 DIAGNOSIS — Z6832 Body mass index (BMI) 32.0-32.9, adult: Secondary | ICD-10-CM

## 2021-06-26 DIAGNOSIS — E119 Type 2 diabetes mellitus without complications: Secondary | ICD-10-CM | POA: Diagnosis not present

## 2021-06-26 DIAGNOSIS — E785 Hyperlipidemia, unspecified: Secondary | ICD-10-CM | POA: Diagnosis not present

## 2021-06-26 DIAGNOSIS — B029 Zoster without complications: Secondary | ICD-10-CM | POA: Diagnosis not present

## 2021-06-26 DIAGNOSIS — R809 Proteinuria, unspecified: Secondary | ICD-10-CM

## 2021-06-26 LAB — BAYER DCA HB A1C WAIVED: HB A1C (BAYER DCA - WAIVED): 7.8 % — ABNORMAL HIGH (ref 4.8–5.6)

## 2021-06-26 MED ORDER — LISINOPRIL 10 MG PO TABS: ORAL_TABLET | ORAL | 1 refills | Status: DC

## 2021-06-26 MED ORDER — METFORMIN HCL 1000 MG PO TABS: 1000.0000 mg | ORAL_TABLET | Freq: Two times a day (BID) | ORAL | 1 refills | Status: DC

## 2021-06-26 MED ORDER — GLIMEPIRIDE 4 MG PO TABS: ORAL_TABLET | ORAL | 1 refills | Status: DC

## 2021-06-26 MED ORDER — TRULICITY 1.5 MG/0.5ML ~~LOC~~ SOAJ: SUBCUTANEOUS | 5 refills | Status: DC

## 2021-06-26 MED ORDER — ROSUVASTATIN CALCIUM 10 MG PO TABS: 10.0000 mg | ORAL_TABLET | Freq: Every day | ORAL | 1 refills | Status: DC

## 2021-06-26 MED ORDER — DAPAGLIFLOZIN PROPANEDIOL 10 MG PO TABS: 10.0000 mg | ORAL_TABLET | Freq: Every day | ORAL | 1 refills | Status: DC

## 2021-06-26 NOTE — Progress Notes (Signed)
Subjective:    Patient ID: Steven Mcgee, male    DOB: Dec 31, 1970, 51 y.o.   MRN: [id number]  Chief Complaint: medical management of chronic issues     HPI:  Steven Mcgee is a 51 y.o. who identifies as a male who was assigned male at birth.   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 Hyperlipidemia associated with type 2 diabetes mellitus ([place]) Does not watch diet and does very little exercise. Lab Results  Component Value Date   CHOL 119 04/03/2021   HDL 33 (L) 04/03/2021   LDLCALC 35 04/03/2021   TRIG 345 (H) 04/03/2021   CHOLHDL 3.6 04/03/2021   The ASCVD Risk score (Arnett DK, et al., 2019) failed to calculate for the following reasons:   The valid total cholesterol range is 130 to 320 mg/dL   2. Type 2 diabetes mellitus without complication, without long-term current use of insulin ([place]) He does not check his blood sugars at home. He doesn't watch diet at all either. No changes were made to meds at last visit. Lab Results  Component Value Date   HGBA1C 8.4 (H) 04/03/2021     3. BMI 32.0-32.9,adult Weight is down 5lbs  Wt Readings from Last 3 Encounters:  06/26/21 221 lb (100.2 kg)  04/03/21 226 lb (102.5 kg)  01/01/21 227 lb (103 kg)   BMI Readings from Last 3 Encounters:  06/26/21 30.82 kg/m  04/03/21 31.52 kg/m  01/01/21 31.66 kg/m     New complaints: Has rash on left flank- burned and itched when first is drying up now.  Allergies  Allergen Reactions   Sulfa Antibiotics    Outpatient Encounter Medications as of 06/26/2021  Medication Sig   dapagliflozin propanediol (FARXIGA) 10 MG TABS tablet Take 1 tablet (10 mg total) by mouth daily.   Dulaglutide (TRULICITY) 1.53 YO/3.7CH SOPN INJECT 1.5MG INTO THE SKIN ONCE A WEEK   fenofibrate (TRICOR) 145 MG tablet TAKE ONE TABLET BY MOUTH DAILY   glimepiride (AMARYL) 4  MG tablet TAKE ONE TABLET BY MOUTH IN THE MORNING AND AT BEDTIME   ketoconazole (NIZORAL) 200 MG tablet Take 1 tablet (200 mg total) by mouth daily.   lisinopril (ZESTRIL) 10 MG tablet TAKE ONE TABLET BY MOUTH DAILY   metFORMIN (GLUCOPHAGE) 1000 MG tablet Take 1 tablet (1,000 mg total) by mouth 2 (two) times daily with a meal.   rosuvastatin (CRESTOR) 10 MG tablet Take 1 tablet (10 mg total) by mouth daily.   No facility-administered encounter medications on file as of 06/26/2021.    Past Surgical History:  Procedure Laterality Date   APPENDECTOMY      Family History  Problem Relation Age of Onset   Cancer Mother       Controlled substance contract: n/a     Review of Systems  Constitutional:  Negative for diaphoresis.  Eyes:  Negative for pain.  Respiratory:  Negative for shortness of breath.   Cardiovascular:  Negative for chest pain, palpitations and leg swelling.  Gastrointestinal:  Negative for abdominal pain.  Endocrine: Negative for polydipsia.  Skin:  Negative for rash.  Neurological:  Negative for dizziness, weakness and headaches.  Hematological:  Does not bruise/bleed easily.  All other systems reviewed and are negative.     Objective:   Physical Exam Vitals and nursing note reviewed.  Constitutional:  Appearance: Normal appearance. He is well-developed.  HENT:     Head: Normocephalic.     Nose: Nose normal.     Mouth/Throat:     Mouth: Mucous membranes are moist.     Pharynx: Oropharynx is clear.  Eyes:     Pupils: Pupils are equal, round, and reactive to light.  Neck:     Thyroid: No thyroid mass or thyromegaly.     Vascular: No carotid bruit or JVD.     Trachea: Phonation normal.  Cardiovascular:     Rate and Rhythm: Normal rate and regular rhythm.  Pulmonary:     Effort: Pulmonary effort is normal. No respiratory distress.     Breath sounds: Normal breath sounds.  Abdominal:     General: Bowel sounds are normal.     Palpations: Abdomen  is soft.     Tenderness: There is no abdominal tenderness.  Musculoskeletal:        General: Normal range of motion.     Cervical back: Normal range of motion and neck supple.  Lymphadenopathy:     Cervical: No cervical adenopathy.  Skin:    General: Skin is warm and dry.  Neurological:     Mental Status: He is alert and oriented to person, place, and time.  Psychiatric:        Behavior: Behavior normal.        Thought Content: Thought content normal.        Judgment: Judgment normal.    BP 116/77    Pulse 90    Temp 98.1 F (36.7 C) (Temporal)    Resp 20    Ht 5' 11"  (1.803 m)    Wt 221 lb (100.2 kg)    SpO2 96%    BMI 30.82 kg/m        Assessment & Plan:   Steven Mcgee comes in today with chief complaint of Medical Management of Chronic Issues   Diagnosis and orders addressed:  1. Hyperlipidemia associated with type 2 diabetes mellitus (HCC) Low fat diet - CBC with Differential/Platelet - CMP14+EGFR - Lipid panel - rosuvastatin (CRESTOR) 10 MG tablet; Take 1 tablet (10 mg total) by mouth daily.  Dispense: 90 tablet; Refill: 1  2. Type 2 diabetes mellitus without complication, without long-term current use of insulin (HCC) Watch carbsin diet - Bayer DCA Hb A1c Waived - Dulaglutide (TRULICITY) 1.5 QK/8.6NO SOPN; INJECT 1.5MG INTO THE SKIN ONCE A WEEK  Dispense: 2 mL; Refill: 5 - dapagliflozin propanediol (FARXIGA) 10 MG TABS tablet; Take 1 tablet (10 mg total) by mouth daily.  Dispense: 90 tablet; Refill: 1 - metFORMIN (GLUCOPHAGE) 1000 MG tablet; Take 1 tablet (1,000 mg total) by mouth 2 (two) times daily with a meal.  Dispense: 180 tablet; Refill: 1 - glimepiride (AMARYL) 4 MG tablet; TAKE ONE TABLET BY MOUTH IN THE MORNING AND AT BEDTIME  Dispense: 180 tablet; Refill: 1  3. BMI 32.0-32.9,adult Discussed diet and exercise for person with BMI >25 Will recheck weight in 3-6 months   4. Herpes zoster without complication Avoid rubbing or scratching. To late  for treatment  5. Microalbuminuria pending - lisinopril (ZESTRIL) 10 MG tablet; TAKE ONE TABLET BY MOUTH DAILY  Dispense: 90 tablet; Refill: 1   Labs pending Health Maintenance reviewed Diet and exercise encouraged  Follow up plan: 3 months   [name] Hassell Done, FNP

## 2021-06-27 LAB — LIPID PANEL
Chol/HDL Ratio: 3.9 ratio (ref 0.0–5.0)
Cholesterol, Total: 132 mg/dL (ref 100–199)
HDL: 34 mg/dL — ABNORMAL LOW (ref >39)
LDL Chol Calc (NIH): 60 mg/dL (ref 0–99)
Triglycerides: 233 mg/dL — ABNORMAL HIGH (ref 0–149)
VLDL Cholesterol Cal: 38 mg/dL (ref 5–40)

## 2021-06-27 LAB — CBC WITH DIFFERENTIAL/PLATELET
Basophils Absolute: 0 10*3/uL (ref 0.0–0.2)
Basos: 0 %
EOS (ABSOLUTE): 0.1 10*3/uL (ref 0.0–0.4)
Eos: 1 %
Hematocrit: 46 % (ref 37.5–51.0)
Hemoglobin: 15.7 g/dL (ref 13.0–17.7)
Immature Grans (Abs): 0 10*3/uL (ref 0.0–0.1)
Immature Granulocytes: 0 %
Lymphocytes Absolute: 2.4 10*3/uL (ref 0.7–3.1)
Lymphs: 35 %
MCH: 30 pg (ref 26.6–33.0)
MCHC: 34.1 g/dL (ref 31.5–35.7)
MCV: 88 fL (ref 79–97)
Monocytes Absolute: 0.5 10*3/uL (ref 0.1–0.9)
Monocytes: 8 %
Neutrophils Absolute: 3.9 10*3/uL (ref 1.4–7.0)
Neutrophils: 56 %
Platelets: 279 10*3/uL (ref 150–450)
RBC: 5.24 x10E6/uL (ref 4.14–5.80)
RDW: 12.1 % (ref 11.6–15.4)
WBC: 7 10*3/uL (ref 3.4–10.8)

## 2021-06-27 LAB — CMP14+EGFR
ALT: 25 IU/L (ref 0–44)
AST: 20 IU/L (ref 0–40)
Albumin/Globulin Ratio: 2.2 (ref 1.2–2.2)
Albumin: 4.9 g/dL (ref 3.8–4.9)
Alkaline Phosphatase: 42 IU/L — ABNORMAL LOW (ref 44–121)
BUN/Creatinine Ratio: 17 (ref 9–20)
BUN: 21 mg/dL (ref 6–24)
Bilirubin Total: 0.2 mg/dL (ref 0.0–1.2)
CO2: 23 mmol/L (ref 20–29)
Calcium: 9.7 mg/dL (ref 8.7–10.2)
Chloride: 101 mmol/L (ref 96–106)
Creatinine, Ser: 1.21 mg/dL (ref 0.76–1.27)
Globulin, Total: 2.2 g/dL (ref 1.5–4.5)
Glucose: 212 mg/dL — ABNORMAL HIGH (ref 70–99)
Potassium: 4.5 mmol/L (ref 3.5–5.2)
Sodium: 137 mmol/L (ref 134–144)
Total Protein: 7.1 g/dL (ref 6.0–8.5)
eGFR: 72 mL/min/{1.73_m2} (ref >59)

## 2021-08-25 ENCOUNTER — Other Ambulatory Visit: Payer: Self-pay | Admitting: Nurse Practitioner

## 2021-08-25 DIAGNOSIS — E1169 Type 2 diabetes mellitus with other specified complication: Secondary | ICD-10-CM

## 2021-09-25 ENCOUNTER — Other Ambulatory Visit: Payer: Self-pay | Admitting: Nurse Practitioner

## 2021-09-25 DIAGNOSIS — E119 Type 2 diabetes mellitus without complications: Secondary | ICD-10-CM

## 2021-09-25 DIAGNOSIS — R809 Proteinuria, unspecified: Secondary | ICD-10-CM

## 2021-09-25 DIAGNOSIS — E1169 Type 2 diabetes mellitus with other specified complication: Secondary | ICD-10-CM

## 2021-10-01 ENCOUNTER — Ambulatory Visit: Payer: BC Managed Care – PPO | Admitting: Nurse Practitioner

## 2021-10-01 ENCOUNTER — Encounter: Payer: Self-pay | Admitting: Nurse Practitioner

## 2021-10-01 VITALS — BP 113/79 | HR 98 | Temp 97.1°F | Resp 20 | Ht 71.0 in | Wt 224.0 lb

## 2021-10-01 DIAGNOSIS — E1169 Type 2 diabetes mellitus with other specified complication: Secondary | ICD-10-CM

## 2021-10-01 DIAGNOSIS — Z6832 Body mass index (BMI) 32.0-32.9, adult: Secondary | ICD-10-CM | POA: Diagnosis not present

## 2021-10-01 DIAGNOSIS — R809 Proteinuria, unspecified: Secondary | ICD-10-CM

## 2021-10-01 DIAGNOSIS — E119 Type 2 diabetes mellitus without complications: Secondary | ICD-10-CM | POA: Diagnosis not present

## 2021-10-01 DIAGNOSIS — E785 Hyperlipidemia, unspecified: Secondary | ICD-10-CM | POA: Diagnosis not present

## 2021-10-01 LAB — BAYER DCA HB A1C WAIVED: HB A1C (BAYER DCA - WAIVED): 8.3 % — ABNORMAL HIGH (ref 4.8–5.6)

## 2021-10-01 MED ORDER — LISINOPRIL 10 MG PO TABS: 10.0000 mg | ORAL_TABLET | Freq: Every day | ORAL | 1 refills | Status: DC

## 2021-10-01 MED ORDER — METFORMIN HCL 1000 MG PO TABS: 1000.0000 mg | ORAL_TABLET | Freq: Two times a day (BID) | ORAL | 1 refills | Status: DC

## 2021-10-01 MED ORDER — ROSUVASTATIN CALCIUM 10 MG PO TABS: 10.0000 mg | ORAL_TABLET | Freq: Every day | ORAL | 1 refills | Status: DC

## 2021-10-01 MED ORDER — DAPAGLIFLOZIN PROPANEDIOL 10 MG PO TABS: 10.0000 mg | ORAL_TABLET | Freq: Every day | ORAL | 1 refills | Status: DC

## 2021-10-01 MED ORDER — TRULICITY 1.5 MG/0.5ML ~~LOC~~ SOAJ: SUBCUTANEOUS | 5 refills | Status: DC

## 2021-10-01 MED ORDER — FENOFIBRATE 145 MG PO TABS: 145.0000 mg | ORAL_TABLET | Freq: Every day | ORAL | 1 refills | Status: DC

## 2021-10-01 MED ORDER — GLIMEPIRIDE 4 MG PO TABS: 4.0000 mg | ORAL_TABLET | Freq: Two times a day (BID) | ORAL | 1 refills | Status: DC

## 2021-10-01 NOTE — Patient Instructions (Signed)

## 2021-10-01 NOTE — Progress Notes (Signed)
? ?Subjective:  ? ? Patient ID: Steven Mcgee, male    DOB: March 14, 1971, 51 y.o.   MRN: [id number] ? ?Chief Complaint: medical management of chronic issues  ? ?HPI: ? ?Steven Mcgee is a 51 y.o. who identifies as a male who was assigned male at birth.  ? ?Social history: ?Lives with: girlfriend ?Work history: truck Geophysicist/field seismologist ? ? ?Comes in today for follow up of the following chronic medical issues: ? ?1. Hyperlipidemia associated with type 2 diabetes mellitus ([place]) ?Does not watch diet and does no dedicated exercise. ?Lab Results  ?Component Value Date  ? CHOL 132 06/26/2021  ? HDL 34 (L) 06/26/2021  ? [place] 60 06/26/2021  ? TRIG 233 (H) 06/26/2021  ? CHOLHDL 3.9 06/26/2021  ? ? ? ?2. Type 2 diabetes mellitus without complication, without long-term current use of insulin ([place]) ?He has not been checking his blood sugars. He also does not watch his diet ?Lab Results  ?Component Value Date  ? HGBA1C 7.8 (H) 06/26/2021  ? ? ? ?3. BMI 32.0-32.9,adult ?No recent weight changes ?Wt Readings from Last 3 Encounters:  ?10/01/21 224 lb (101.6 kg)  ?06/26/21 221 lb (100.2 kg)  ?04/03/21 226 lb (102.5 kg)  ? ?BMI Readings from Last 3 Encounters:  ?10/01/21 31.24 kg/m?  ?06/26/21 30.82 kg/m?  ?04/03/21 31.52 kg/m?  ? ? ? ? ?New complaints: ?None today ? ?Allergies  ?Allergen Reactions  ? Sulfa Antibiotics   ? ?Outpatient Encounter Medications as of 10/01/2021  ?Medication Sig  ? dapagliflozin propanediol (FARXIGA) 10 MG TABS tablet Take 1 tablet (10 mg total) by mouth daily.  ? Dulaglutide (TRULICITY) 1.5 TW/6.5KC SOPN INJECT 1.5MG INTO THE SKIN ONCE A WEEK  ? fenofibrate (TRICOR) 145 MG tablet TAKE ONE TABLET BY MOUTH DAILY  ? glimepiride (AMARYL) 4 MG tablet TAKE ONE TABLET BY MOUTH IN THE MORNING AND AT BEDTIME  ? ketoconazole (NIZORAL) 200 MG tablet Take 1 tablet (200 mg total) by mouth daily.  ? lisinopril (ZESTRIL) 10 MG tablet TAKE ONE TABLET BY MOUTH DAILY  ? metFORMIN (GLUCOPHAGE) 1000 MG tablet Take 1 tablet  (1,000 mg total) by mouth 2 (two) times daily with a meal.  ? rosuvastatin (CRESTOR) 10 MG tablet TAKE ONE TABLET BY MOUTH ONCE DAILY  ? ?No facility-administered encounter medications on file as of 10/01/2021.  ? ? ?Past Surgical History:  ?Procedure Laterality Date  ? APPENDECTOMY    ? ? ?Family History  ?Problem Relation Age of Onset  ? Cancer Mother   ? ? ? ? ?Controlled substance contract: n/a ? ? ? ? ?Review of Systems  ?Constitutional:  Negative for diaphoresis.  ?Eyes:  Negative for pain.  ?Respiratory:  Negative for shortness of breath.   ?Cardiovascular:  Negative for chest pain, palpitations and leg swelling.  ?Gastrointestinal:  Negative for abdominal pain.  ?Endocrine: Negative for polydipsia.  ?Skin:  Negative for rash.  ?Neurological:  Negative for dizziness, weakness and headaches.  ?Hematological:  Does not bruise/bleed easily.  ?All other systems reviewed and are negative. ? ?   ?Objective:  ? Physical Exam ?Vitals and nursing note reviewed.  ?Constitutional:   ?   Appearance: Normal appearance. He is well-developed.  ?HENT:  ?   Head: Normocephalic.  ?   Nose: Nose normal.  ?   Mouth/Throat:  ?   Mouth: Mucous membranes are moist.  ?   Pharynx: Oropharynx is clear.  ?Eyes:  ?   Pupils: Pupils are equal, round, and reactive to light.  ?  Neck:  ?   Thyroid: No thyroid mass or thyromegaly.  ?   Vascular: No carotid bruit or JVD.  ?   Trachea: Phonation normal.  ?Cardiovascular:  ?   Rate and Rhythm: Normal rate and regular rhythm.  ?Pulmonary:  ?   Effort: Pulmonary effort is normal. No respiratory distress.  ?   Breath sounds: Normal breath sounds.  ?Abdominal:  ?   General: Bowel sounds are normal.  ?   Palpations: Abdomen is soft.  ?   Tenderness: There is no abdominal tenderness.  ?Musculoskeletal:     ?   General: Normal range of motion.  ?   Cervical back: Normal range of motion and neck supple.  ?Lymphadenopathy:  ?   Cervical: No cervical adenopathy.  ?Skin: ?   General: Skin is warm and dry.   ?Neurological:  ?   Mental Status: He is alert and oriented to person, place, and time.  ?Psychiatric:     ?   Behavior: Behavior normal.     ?   Thought Content: Thought content normal.     ?   Judgment: Judgment normal.  ? ? ?BP 113/79   Pulse 98   Temp (!) 97.1 ?F (36.2 ?C) (Temporal)   Resp 20   Ht _0  (1.803 m)   Wt 224 lb (101.6 kg)   SpO2 95%   BMI 31.24 kg/m?  ? ?Hgba1c 8.3% ? ? ?   ?Assessment & Plan:  ? ?Steven Mcgee comes in today with chief complaint of Medical Management of Chronic Issues ? ? ?Diagnosis and orders addressed: ? ?1. Hyperlipidemia associated with type 2 diabetes mellitus ([place]) ?Low sodium diet ?- Lipid panel ?- rosuvastatin (CRESTOR) 10 MG tablet; Take 1 tablet (10 mg total) by mouth daily.  Dispense: 90 tablet; Refill: 1 ?- fenofibrate (TRICOR) 145 MG tablet; Take 1 tablet (145 mg total) by mouth daily.  Dispense: 90 tablet; Refill: 1 ? ?2. Type 2 diabetes mellitus without complication, without long-term current use of insulin ([place]) ?Stricter carb counting ?Refeuses medication changes ?- Bayer DCA Hb A1c Waived ?- CBC with Differential/Platelet ?- CMP14+EGFR ?- Dulaglutide (TRULICITY) 1.5 GD/9.2EQ SOPN; INJECT 1.5MG INTO THE SKIN ONCE A WEEK  Dispense: 2 mL; Refill: 5 ?- glimepiride (AMARYL) 4 MG tablet; Take 1 tablet (4 mg total) by mouth in the morning and at bedtime.  Dispense: 180 tablet; Refill: 1 ?- dapagliflozin propanediol (FARXIGA) 10 MG TABS tablet; Take 1 tablet (10 mg total) by mouth daily.  Dispense: 90 tablet; Refill: 1 ?- metFORMIN (GLUCOPHAGE) 1000 MG tablet; Take 1 tablet (1,000 mg total) by mouth 2 (two) times daily with a meal.  Dispense: 180 tablet; Refill: 1 ? ?3. BMI 32.0-32.9,adult ?Discussed diet and exercise for person with BMI >25 ?Will recheck weight in 3-6 months ? ? ?4. Microalbuminuria ?- lisinopril (ZESTRIL) 10 MG tablet; Take 1 tablet (10 mg total) by mouth daily.  Dispense: 90 tablet; Refill: 1 ? ? ?Labs pending ?Health Maintenance  reviewed ?Diet and exercise encouraged ? ?Follow up plan: ?3 months ? ? ?[name] Hassell Done, FNP ? ?

## 2021-10-02 ENCOUNTER — Ambulatory Visit: Payer: BC Managed Care – PPO | Admitting: Nurse Practitioner

## 2021-10-02 LAB — CMP14+EGFR
ALT: 28 IU/L (ref 0–44)
AST: 26 IU/L (ref 0–40)
Albumin/Globulin Ratio: 2 (ref 1.2–2.2)
Albumin: 4.8 g/dL (ref 3.8–4.9)
Alkaline Phosphatase: 44 IU/L (ref 44–121)
BUN/Creatinine Ratio: 15 (ref 9–20)
BUN: 20 mg/dL (ref 6–24)
Bilirubin Total: 0.4 mg/dL (ref 0.0–1.2)
CO2: 20 mmol/L (ref 20–29)
Calcium: 9.6 mg/dL (ref 8.7–10.2)
Chloride: 101 mmol/L (ref 96–106)
Creatinine, Ser: 1.37 mg/dL — ABNORMAL HIGH (ref 0.76–1.27)
Globulin, Total: 2.4 g/dL (ref 1.5–4.5)
Glucose: 278 mg/dL — ABNORMAL HIGH (ref 70–99)
Potassium: 4.7 mmol/L (ref 3.5–5.2)
Sodium: 138 mmol/L (ref 134–144)
Total Protein: 7.2 g/dL (ref 6.0–8.5)
eGFR: 62 mL/min/{1.73_m2} (ref >59)

## 2021-10-02 LAB — CBC WITH DIFFERENTIAL/PLATELET
Basophils Absolute: 0.1 10*3/uL (ref 0.0–0.2)
Basos: 1 %
EOS (ABSOLUTE): 0.1 10*3/uL (ref 0.0–0.4)
Eos: 2 %
Hematocrit: 48 % (ref 37.5–51.0)
Hemoglobin: 16.3 g/dL (ref 13.0–17.7)
Immature Grans (Abs): 0 10*3/uL (ref 0.0–0.1)
Immature Granulocytes: 0 %
Lymphocytes Absolute: 2.3 10*3/uL (ref 0.7–3.1)
Lymphs: 35 %
MCH: 30 pg (ref 26.6–33.0)
MCHC: 34 g/dL (ref 31.5–35.7)
MCV: 88 fL (ref 79–97)
Monocytes Absolute: 0.5 10*3/uL (ref 0.1–0.9)
Monocytes: 7 %
Neutrophils Absolute: 3.7 10*3/uL (ref 1.4–7.0)
Neutrophils: 55 %
Platelets: 304 10*3/uL (ref 150–450)
RBC: 5.43 x10E6/uL (ref 4.14–5.80)
RDW: 13.2 % (ref 11.6–15.4)
WBC: 6.7 10*3/uL (ref 3.4–10.8)

## 2021-10-02 LAB — LIPID PANEL
Chol/HDL Ratio: 4.4 ratio (ref 0.0–5.0)
Cholesterol, Total: 140 mg/dL (ref 100–199)
HDL: 32 mg/dL — ABNORMAL LOW (ref >39)
LDL Chol Calc (NIH): 60 mg/dL (ref 0–99)
Triglycerides: 304 mg/dL — ABNORMAL HIGH (ref 0–149)
VLDL Cholesterol Cal: 48 mg/dL — ABNORMAL HIGH (ref 5–40)

## 2021-10-22 ENCOUNTER — Telehealth: Payer: Self-pay | Admitting: Nurse Practitioner

## 2021-10-22 MED ORDER — VALACYCLOVIR HCL 1 G PO TABS: 1000.0000 mg | ORAL_TABLET | Freq: Three times a day (TID) | ORAL | 0 refills | Status: AC

## 2021-10-22 NOTE — Telephone Encounter (Signed)
  Prescription Request  10/22/2021  Is this a "Controlled Substance" medicine? no  Have you seen your PCP in the last 2 weeks? 10/01/21  If YES, route message to pool  -  If NO, patient needs to be scheduled for appointment.  What is the name of the medication or equipment? Pt thinks his shingles has come. He saw mmm 3 months ago for this . I offered appt but he wanted note sent back Have you contacted your pharmacy to request a refill? no  Which pharmacy would you like this sent to? [facility]   Patient notified that their request is being sent to the clinical staff for review and that they should receive a response within 2 business days.

## 2021-12-07 ENCOUNTER — Encounter: Payer: Self-pay | Admitting: Nurse Practitioner

## 2021-12-07 ENCOUNTER — Ambulatory Visit: Payer: BC Managed Care – PPO | Admitting: Nurse Practitioner

## 2021-12-07 VITALS — BP 102/68 | HR 71 | Temp 97.6°F | Resp 20 | Ht 71.0 in | Wt 223.0 lb

## 2021-12-07 DIAGNOSIS — L309 Dermatitis, unspecified: Secondary | ICD-10-CM | POA: Diagnosis not present

## 2021-12-07 MED ORDER — PREDNISONE 10 MG (21) PO TBPK: ORAL_TABLET | ORAL | 0 refills | Status: DC

## 2021-12-07 NOTE — Patient Instructions (Signed)
Contact Dermatitis Dermatitis is redness, soreness, and swelling (inflammation) of the skin. Contact dermatitis is a reaction to something that touches the skin. There are two types of contact dermatitis: Irritant contact dermatitis. This happens when something bothers (irritates) your skin, like soap. Allergic contact dermatitis. This is caused when you are exposed to something that you are allergic to, such as poison ivy. What are the causes? Common causes of irritant contact dermatitis include: Makeup. Soaps. Detergents. Bleaches. Acids. Metals, such as nickel. Common causes of allergic contact dermatitis include: Plants. Chemicals. Jewelry. Latex. Medicines. Preservatives in products, such as clothing. What increases the risk? Having a job that exposes you to things that bother your skin. Having asthma or eczema. What are the signs or symptoms? Symptoms may happen anywhere the irritant has touched your skin. Symptoms include: Dry or flaky skin. Redness. Cracks. Itching. Pain or a burning feeling. Blisters. Blood or clear fluid draining from skin cracks. With allergic contact dermatitis, swelling may occur. This may happen in places such as the eyelids, mouth, or genitals. How is this treated? This condition is treated by checking for the cause of the reaction and protecting your skin. Treatment may also include: Steroid creams, ointments, or medicines. Antibiotic medicines or other ointments, if you have a skin infection. Lotion or medicines to help with itching. A bandage (dressing). Follow these instructions at home: Skin care Moisturize your skin as needed. Put cool cloths on your skin. Put a baking soda paste on your skin. Stir water into baking soda until it looks like a paste. Do not scratch your skin. Avoid having things rub up against your skin. Avoid the use of soaps, perfumes, and dyes. Medicines Take or apply over-the-counter and prescription medicines  only as told by your doctor. If you were prescribed an antibiotic medicine, take or apply it as told by your doctor. Do not stop using it even if your condition starts to get better. Bathing Take a bath with: Epsom salts. Baking soda. Colloidal oatmeal. Bathe less often. Bathe in warm water. Avoid using hot water. Bandage care If you were given a bandage, change it as told by your doctor. Wash your hands with soap and water before and after you change your bandage. If soap and water are not available, use hand sanitizer. General instructions Avoid the things that caused your reaction. If you do not know what caused it, keep a journal. Write down: What you eat. What skin products you use. What you drink. What you wear in the area that has symptoms. This includes jewelry. Check the affected areas every day for signs of infection. Check for: More redness, swelling, or pain. More fluid or blood. Warmth. Pus or a bad smell. Keep all follow-up visits as told by your doctor. This is important. Contact a doctor if: You do not get better with treatment. Your condition gets worse. You have signs of infection, such as: More swelling. Tenderness. More redness. Soreness. Warmth. You have a fever. You have new symptoms. Get help right away if: You have a very bad headache. You have neck pain. Your neck is stiff. You throw up (vomit). You feel very sleepy. You see red streaks coming from the area. Your bone or joint near the area hurts after the skin has healed. The area turns darker. You have trouble breathing. Summary Dermatitis is redness, soreness, and swelling of the skin. Symptoms may occur where the irritant has touched you. Treatment may include medicines and skin care. If you do not   know what caused your reaction, keep a journal. Contact a doctor if your condition gets worse or you have signs of infection. This information is not intended to replace advice given to you by  your health care provider. Make sure you discuss any questions you have with your health care provider. Document Revised: 03/09/2021 Document Reviewed: 03/09/2021 Elsevier Patient Education  2023 Elsevier Inc.  

## 2021-12-07 NOTE — Progress Notes (Signed)
   Subjective:    Patient ID: Steven Mcgee, male    DOB: Apr 05, 1971, 51 y.o.   MRN: [id number]   Chief Complaint: Rash on stomach   HPI Patient comes in  with rash on left flank. Slightly itchy, no pain. Started 1 day last week.    Review of Systems  Constitutional:  Negative for diaphoresis.  Eyes:  Negative for pain.  Respiratory:  Negative for shortness of breath.   Cardiovascular:  Negative for chest pain, palpitations and leg swelling.  Gastrointestinal:  Negative for abdominal pain.  Endocrine: Negative for polydipsia.  Skin:  Negative for rash.  Neurological:  Negative for dizziness, weakness and headaches.  Hematological:  Does not bruise/bleed easily.  All other systems reviewed and are negative.      Objective:   Physical Exam Vitals reviewed.  Constitutional:      Appearance: Normal appearance.  Cardiovascular:     Rate and Rhythm: Normal rate and regular rhythm.     Heart sounds: Normal heart sounds.  Pulmonary:     Breath sounds: Normal breath sounds.  Skin:    General: Skin is warm.     Findings: Rash (erythematous macular rash on left lower chest wall.) present.  Neurological:     General: No focal deficit present.     Mental Status: He is alert and oriented to person, place, and time.  Psychiatric:        Mood and Affect: Mood normal.        Behavior: Behavior normal.     BP 102/68   Pulse 71   Temp 97.6 F (36.4 C) (Temporal)   Resp 20   Ht 5\' 11"  (1.803 m)   Wt 223 lb (101.2 kg)   SpO2 97%   BMI 31.10 kg/m          Assessment & Plan:   in today with chief complaint of Rash on stomach   1. Dermatitis Cool compresses Avoid scratching - predniSONE (STERAPRED UNI-PAK 21 TAB) 10 MG (21) TBPK tablet; As directed x 6 days  Dispense: 21 tablet; Refill: 0    The above assessment and management plan was discussed with the patient. The patient verbalized understanding of and has agreed to the management plan.  Patient is aware to call the clinic if symptoms persist or worsen. Patient is aware when to return to the clinic for a follow-up visit. Patient educated on when it is appropriate to go to the emergency department.   [name] Martina Sinner, FNP

## 2022-01-01 ENCOUNTER — Encounter: Payer: Self-pay | Admitting: Nurse Practitioner

## 2022-01-01 ENCOUNTER — Ambulatory Visit: Payer: BC Managed Care – PPO | Admitting: Nurse Practitioner

## 2022-01-01 VITALS — BP 106/71 | HR 78 | Temp 97.0°F | Resp 20 | Ht 71.0 in | Wt 225.0 lb

## 2022-01-01 DIAGNOSIS — Z6832 Body mass index (BMI) 32.0-32.9, adult: Secondary | ICD-10-CM | POA: Diagnosis not present

## 2022-01-01 DIAGNOSIS — E785 Hyperlipidemia, unspecified: Secondary | ICD-10-CM

## 2022-01-01 DIAGNOSIS — Z125 Encounter for screening for malignant neoplasm of prostate: Secondary | ICD-10-CM

## 2022-01-01 DIAGNOSIS — E1169 Type 2 diabetes mellitus with other specified complication: Secondary | ICD-10-CM

## 2022-01-01 DIAGNOSIS — R809 Proteinuria, unspecified: Secondary | ICD-10-CM

## 2022-01-01 DIAGNOSIS — E119 Type 2 diabetes mellitus without complications: Secondary | ICD-10-CM | POA: Diagnosis not present

## 2022-01-01 LAB — BAYER DCA HB A1C WAIVED: HB A1C (BAYER DCA - WAIVED): 8.3 % — ABNORMAL HIGH (ref 4.8–5.6)

## 2022-01-01 MED ORDER — ROSUVASTATIN CALCIUM 10 MG PO TABS: 10.0000 mg | ORAL_TABLET | Freq: Every day | ORAL | 1 refills | Status: DC

## 2022-01-01 MED ORDER — GLIMEPIRIDE 4 MG PO TABS: 4.0000 mg | ORAL_TABLET | Freq: Two times a day (BID) | ORAL | 1 refills | Status: DC

## 2022-01-01 MED ORDER — TRULICITY 1.5 MG/0.5ML ~~LOC~~ SOAJ: SUBCUTANEOUS | 5 refills | Status: DC

## 2022-01-01 MED ORDER — FENOFIBRATE 145 MG PO TABS: 145.0000 mg | ORAL_TABLET | Freq: Every day | ORAL | 1 refills | Status: DC

## 2022-01-01 MED ORDER — DAPAGLIFLOZIN PROPANEDIOL 10 MG PO TABS: 10.0000 mg | ORAL_TABLET | Freq: Every day | ORAL | 1 refills | Status: DC

## 2022-01-01 MED ORDER — LISINOPRIL 10 MG PO TABS: 10.0000 mg | ORAL_TABLET | Freq: Every day | ORAL | 1 refills | Status: DC

## 2022-01-01 MED ORDER — METFORMIN HCL 1000 MG PO TABS: 1000.0000 mg | ORAL_TABLET | Freq: Two times a day (BID) | ORAL | 1 refills | Status: DC

## 2022-01-01 NOTE — Progress Notes (Signed)
Subjective:    Patient ID: Steven Mcgee, male    DOB: 1971/01/24, 51 y.o.   MRN: [id number]   Chief Complaint: medical management of chronic issues     HPI:  Steven Mcgee is a 51 y.o. who identifies as a male who was assigned male at birth.   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 Hyperlipidemia associated with type 2 diabetes mellitus ([place]) Does not watch diet and does no dedicated exercise. Lab Results  Component Value Date   CHOL 140 10/01/2021   HDL 32 (L) 10/01/2021   LDLCALC 60 10/01/2021   TRIG 304 (H) 10/01/2021   CHOLHDL 4.4 10/01/2021    The 10-year ASCVD risk score (Arnett DK, et al., 2019) is: 6.2%  2. Type 2 diabetes mellitus without complication, without long-term current use of insulin ([facility]) He does not check his blood sugars daily. Only checks 1-2 x a week. Lab Results  Component Value Date   HGBA1C 8.3 (H) 10/01/2021     3. BMI 32.0-32.9,adult No recent weight changes Wt Readings from Last 3 Encounters:  01/01/22 225 lb (102.1 kg)  12/07/21 223 lb (101.2 kg)  10/01/21 224 lb (101.6 kg)   BMI Readings from Last 3 Encounters:  01/01/22 31.38 kg/m  12/07/21 31.10 kg/m  10/01/21 31.24 kg/m      New complaints: None today  Allergies  Allergen Reactions   Sulfa Antibiotics    Outpatient Encounter Medications as of 01/01/2022  Medication Sig   dapagliflozin propanediol (FARXIGA) 10 MG TABS tablet Take 1 tablet (10 mg total) by mouth daily.   Dulaglutide (TRULICITY) 1.5 YQ/6.5HQ SOPN INJECT 1.5MG INTO THE SKIN ONCE A WEEK   fenofibrate (TRICOR) 145 MG tablet Take 1 tablet (145 mg total) by mouth daily.   glimepiride (AMARYL) 4 MG tablet Take 1 tablet (4 mg total) by mouth in the morning and at bedtime.   ketoconazole (NIZORAL) 200 MG tablet Take 1 tablet (200 mg total) by mouth daily.    lisinopril (ZESTRIL) 10 MG tablet Take 1 tablet (10 mg total) by mouth daily.   metFORMIN (GLUCOPHAGE) 1000 MG tablet Take 1 tablet (1,000 mg total) by mouth 2 (two) times daily with a meal.   predniSONE (STERAPRED UNI-PAK 21 TAB) 10 MG (21) TBPK tablet As directed x 6 days   rosuvastatin (CRESTOR) 10 MG tablet Take 1 tablet (10 mg total) by mouth daily.   valACYclovir (VALTREX) 1000 MG tablet Take 1 tablet (1,000 mg total) by mouth 3 (three) times daily.   No facility-administered encounter medications on file as of 01/01/2022.    Past Surgical History:  Procedure Laterality Date   APPENDECTOMY      Family History  Problem Relation Age of Onset   Cancer Mother       Controlled substance contract: n/a     Review of Systems  Constitutional:  Negative for diaphoresis.  Eyes:  Negative for pain.  Respiratory:  Negative for shortness of breath.   Cardiovascular:  Negative for chest pain, palpitations and leg swelling.  Gastrointestinal:  Negative for abdominal pain.  Endocrine: Negative for polydipsia.  Skin:  Negative for rash.  Neurological:  Negative for dizziness, weakness and headaches.  Hematological:  Does not bruise/bleed easily.  All other systems reviewed and are negative.      Objective:   Physical Exam Vitals and  nursing note reviewed.  Constitutional:      Appearance: Normal appearance. He is well-developed.  HENT:     Head: Normocephalic.     Nose: Nose normal.     Mouth/Throat:     Mouth: Mucous membranes are moist.     Pharynx: Oropharynx is clear.  Eyes:     Pupils: Pupils are equal, round, and reactive to light.  Neck:     Thyroid: No thyroid mass or thyromegaly.     Vascular: No carotid bruit or JVD.     Trachea: Phonation normal.  Cardiovascular:     Rate and Rhythm: Normal rate and regular rhythm.  Pulmonary:     Effort: Pulmonary effort is normal. No respiratory distress.     Breath sounds: Normal breath sounds.  Abdominal:     General:  Bowel sounds are normal.     Palpations: Abdomen is soft.     Tenderness: There is no abdominal tenderness.  Musculoskeletal:        General: Normal range of motion.     Cervical back: Normal range of motion and neck supple.  Lymphadenopathy:     Cervical: No cervical adenopathy.  Skin:    General: Skin is warm and dry.  Neurological:     Mental Status: He is alert and oriented to person, place, and time.  Psychiatric:        Behavior: Behavior normal.        Thought Content: Thought content normal.        Judgment: Judgment normal.     BP 106/71   Pulse 78   Temp (!) 97 F (36.1 C) (Temporal)   Resp 20   Ht _0  (1.803 m)   Wt 225 lb (102.1 kg)   SpO2 98%   BMI 31.38 kg/m    Hgba1c 8.3%      Assessment & Plan:   Steven Mcgee comes in today with chief complaint of Medical Management of Chronic Issues   Diagnosis and orders addressed:  1. Hyperlipidemia associated with type 2 diabetes mellitus ([facility]) Low fat diet - Lipid panel - fenofibrate (TRICOR) 145 MG tablet; Take 1 tablet (145 mg total) by mouth daily.  Dispense: 90 tablet; Refill: 1 - rosuvastatin (CRESTOR) 10 MG tablet; Take 1 tablet (10 mg total) by mouth daily.  Dispense: 90 tablet; Refill: 1  2. Type 2 diabetes mellitus without complication, without long-term current use of insulin ([facility]) Stricter carb counting - Bayer DCA Hb A1c Waived - CBC with Differential/Platelet - CMP14+EGFR - Microalbumin / creatinine urine ratio - glimepiride (AMARYL) 4 MG tablet; Take 1 tablet (4 mg total) by mouth in the morning and at bedtime.  Dispense: 180 tablet; Refill: 1 - dapagliflozin propanediol (FARXIGA) 10 MG TABS tablet; Take 1 tablet (10 mg total) by mouth daily.  Dispense: 90 tablet; Refill: 1 - metFORMIN (GLUCOPHAGE) 1000 MG tablet; Take 1 tablet (1,000 mg total) by mouth 2 (two) times daily with a meal.  Dispense: 180 tablet; Refill: 1 - Dulaglutide (TRULICITY) 1.5 UK/0.2RK SOPN; INJECT 1.5MG INTO THE  SKIN ONCE A WEEK  Dispense: 2 mL; Refill: 5  3. BMI 32.0-32.9,adult Discussed diet and exercise for person with BMI >25 Will recheck weight in 3-6 months   4. Prostate cancer screening Labs pending - PSA, total and free  5. Microalbuminuria Labs pending - lisinopril (ZESTRIL) 10 MG tablet; Take 1 tablet (10 mg total) by mouth daily.  Dispense: 90 tablet; Refill: 1   Labs pending Health  Maintenance reviewed Diet and exercise encouraged  Follow up plan: 3 months   [name] Hassell Done, FNP

## 2022-01-01 NOTE — Patient Instructions (Signed)

## 2022-01-02 LAB — CMP14+EGFR
ALT: 22 IU/L (ref 0–44)
AST: 18 IU/L (ref 0–40)
Albumin/Globulin Ratio: 2.2 (ref 1.2–2.2)
Albumin: 4.9 g/dL (ref 3.8–4.9)
Alkaline Phosphatase: 42 IU/L — ABNORMAL LOW (ref 44–121)
BUN/Creatinine Ratio: 12 (ref 9–20)
BUN: 14 mg/dL (ref 6–24)
Bilirubin Total: 0.3 mg/dL (ref 0.0–1.2)
CO2: 22 mmol/L (ref 20–29)
Calcium: 9.8 mg/dL (ref 8.7–10.2)
Chloride: 101 mmol/L (ref 96–106)
Creatinine, Ser: 1.2 mg/dL (ref 0.76–1.27)
Globulin, Total: 2.2 g/dL (ref 1.5–4.5)
Glucose: 270 mg/dL — ABNORMAL HIGH (ref 70–99)
Potassium: 4.7 mmol/L (ref 3.5–5.2)
Sodium: 140 mmol/L (ref 134–144)
Total Protein: 7.1 g/dL (ref 6.0–8.5)
eGFR: 72 mL/min/{1.73_m2} (ref >59)

## 2022-01-02 LAB — CBC WITH DIFFERENTIAL/PLATELET
Basophils Absolute: 0 10*3/uL (ref 0.0–0.2)
Basos: 1 %
EOS (ABSOLUTE): 0.1 10*3/uL (ref 0.0–0.4)
Eos: 2 %
Hematocrit: 47.8 % (ref 37.5–51.0)
Hemoglobin: 15.6 g/dL (ref 13.0–17.7)
Immature Grans (Abs): 0 10*3/uL (ref 0.0–0.1)
Immature Granulocytes: 0 %
Lymphocytes Absolute: 2.2 10*3/uL (ref 0.7–3.1)
Lymphs: 38 %
MCH: 29.5 pg (ref 26.6–33.0)
MCHC: 32.6 g/dL (ref 31.5–35.7)
MCV: 91 fL (ref 79–97)
Monocytes Absolute: 0.4 10*3/uL (ref 0.1–0.9)
Monocytes: 8 %
Neutrophils Absolute: 2.9 10*3/uL (ref 1.4–7.0)
Neutrophils: 51 %
Platelets: 283 10*3/uL (ref 150–450)
RBC: 5.28 x10E6/uL (ref 4.14–5.80)
RDW: 12.7 % (ref 11.6–15.4)
WBC: 5.7 10*3/uL (ref 3.4–10.8)

## 2022-01-02 LAB — LIPID PANEL
Chol/HDL Ratio: 3.4 ratio (ref 0.0–5.0)
Cholesterol, Total: 118 mg/dL (ref 100–199)
HDL: 35 mg/dL — ABNORMAL LOW (ref >39)
LDL Chol Calc (NIH): 45 mg/dL (ref 0–99)
Triglycerides: 245 mg/dL — ABNORMAL HIGH (ref 0–149)
VLDL Cholesterol Cal: 38 mg/dL (ref 5–40)

## 2022-01-02 LAB — MICROALBUMIN / CREATININE URINE RATIO
Creatinine, Urine: 66.7 mg/dL
Microalb/Creat Ratio: <4 mg/g creat (ref 0–29)
Microalbumin, Urine: <3 ug/mL

## 2022-01-02 LAB — PSA, TOTAL AND FREE
PSA, Free Pct: 24 %
PSA, Free: 0.24 ng/mL
Prostate Specific Ag, Serum: 1 ng/mL (ref 0.0–4.0)

## 2022-01-23 ENCOUNTER — Other Ambulatory Visit: Payer: Self-pay | Admitting: Nurse Practitioner

## 2022-01-23 DIAGNOSIS — E119 Type 2 diabetes mellitus without complications: Secondary | ICD-10-CM

## 2022-01-30 ENCOUNTER — Other Ambulatory Visit: Payer: Self-pay | Admitting: Nurse Practitioner

## 2022-01-30 DIAGNOSIS — E119 Type 2 diabetes mellitus without complications: Secondary | ICD-10-CM

## 2022-04-09 ENCOUNTER — Encounter: Payer: Self-pay | Admitting: Nurse Practitioner

## 2022-04-09 ENCOUNTER — Ambulatory Visit: Payer: BC Managed Care – PPO | Admitting: Nurse Practitioner

## 2022-04-09 VITALS — BP 113/78 | HR 89 | Temp 97.2°F | Resp 20 | Ht 71.0 in | Wt 221.0 lb

## 2022-04-09 DIAGNOSIS — Z6832 Body mass index (BMI) 32.0-32.9, adult: Secondary | ICD-10-CM | POA: Diagnosis not present

## 2022-04-09 DIAGNOSIS — Z024 Encounter for examination for driving license: Secondary | ICD-10-CM

## 2022-04-09 DIAGNOSIS — E119 Type 2 diabetes mellitus without complications: Secondary | ICD-10-CM

## 2022-04-09 DIAGNOSIS — R809 Proteinuria, unspecified: Secondary | ICD-10-CM | POA: Diagnosis not present

## 2022-04-09 DIAGNOSIS — E1169 Type 2 diabetes mellitus with other specified complication: Secondary | ICD-10-CM | POA: Diagnosis not present

## 2022-04-09 DIAGNOSIS — E785 Hyperlipidemia, unspecified: Secondary | ICD-10-CM

## 2022-04-09 LAB — URINALYSIS
Bilirubin, UA: NEGATIVE
Ketones, UA: NEGATIVE
Leukocytes,UA: NEGATIVE
Nitrite, UA: NEGATIVE
Protein,UA: NEGATIVE
RBC, UA: NEGATIVE
Specific Gravity, UA: 1.02 (ref 1.005–1.030)
Urobilinogen, Ur: 0.2 mg/dL (ref 0.2–1.0)
pH, UA: 6.5 (ref 5.0–7.5)

## 2022-04-09 LAB — BAYER DCA HB A1C WAIVED: HB A1C (BAYER DCA - WAIVED): 8.3 % — ABNORMAL HIGH (ref 4.8–5.6)

## 2022-04-09 MED ORDER — FENOFIBRATE 145 MG PO TABS: 145.0000 mg | ORAL_TABLET | Freq: Every day | ORAL | 1 refills | Status: DC

## 2022-04-09 MED ORDER — METFORMIN HCL 1000 MG PO TABS: 1000.0000 mg | ORAL_TABLET | Freq: Two times a day (BID) | ORAL | 1 refills | Status: DC

## 2022-04-09 MED ORDER — TRULICITY 1.5 MG/0.5ML ~~LOC~~ SOAJ: SUBCUTANEOUS | 5 refills | Status: DC

## 2022-04-09 MED ORDER — DAPAGLIFLOZIN PROPANEDIOL 10 MG PO TABS: 10.0000 mg | ORAL_TABLET | Freq: Every day | ORAL | 1 refills | Status: DC

## 2022-04-09 MED ORDER — GLIMEPIRIDE 4 MG PO TABS: 4.0000 mg | ORAL_TABLET | Freq: Two times a day (BID) | ORAL | 1 refills | Status: DC

## 2022-04-09 MED ORDER — ROSUVASTATIN CALCIUM 10 MG PO TABS: 10.0000 mg | ORAL_TABLET | Freq: Every day | ORAL | 1 refills | Status: DC

## 2022-04-09 MED ORDER — LISINOPRIL 10 MG PO TABS: 10.0000 mg | ORAL_TABLET | Freq: Every day | ORAL | 1 refills | Status: DC

## 2022-04-09 NOTE — Progress Notes (Signed)
 Subjective:    Patient ID: Steven Mcgee, male    DOB: 03/31/1971, 51 y.o.   MRN: [id number]   Chief Complaint: medical management of chronic issues     HPI:  Steven Mcgee is a 51 y.o. who identifies as a male who was assigned male at birth.   Social history: Lives with: girlfriend Work history: drives a truck   Comes in today for follow up of the following chronic medical issues:  1. Hyperlipidemia associated with type 2 diabetes mellitus (HCC) Does not watch diet very  closely. Lab Results  Component Value Date   CHOL 118 01/01/2022   HDL 35 (L) 01/01/2022   LDLCALC 45 01/01/2022   TRIG 245 (H) 01/01/2022   CHOLHDL 3.4 01/01/2022     2. Type 2 diabetes mellitus without complication, without long-term current use of insulin (HCC) Does not check blood sugars very often. Does not watch diet. Lab Results  Component Value Date   HGBA1C 8.3 (H) 01/01/2022     3. BMI 32.0-32.9,adult No recent weight changes Wt Readings from Last 3 Encounters:  01/01/22 225 lb (102.1 kg)  12/07/21 223 lb (101.2 kg)  10/01/21 224 lb (101.6 kg)   BMI Readings from Last 3 Encounters:  01/01/22 31.38 kg/m  12/07/21 31.10 kg/m  10/01/21 31.24 kg/m     New complaints: None today  Allergies  Allergen Reactions   Sulfa Antibiotics    Outpatient Encounter Medications as of 04/09/2022  Medication Sig   dapagliflozin propanediol (FARXIGA) 10 MG TABS tablet Take 1 tablet (10 mg total) by mouth daily.   Dulaglutide (TRULICITY) 1.5 MG/0.5ML SOPN INJECT 1.5MG INTO THE SKIN ONCE A WEEK   fenofibrate (TRICOR) 145 MG tablet Take 1 tablet (145 mg total) by mouth daily.   glimepiride (AMARYL) 4 MG tablet Take 1 tablet (4 mg total) by mouth in the morning and at bedtime.   ketoconazole (NIZORAL) 200 MG tablet Take 1 tablet (200 mg total) by mouth daily.   lisinopril (ZESTRIL) 10 MG tablet Take 1 tablet (10 mg total) by mouth daily.   metFORMIN (GLUCOPHAGE) 1000 MG tablet Take 1  tablet (1,000 mg total) by mouth 2 (two) times daily with a meal.   rosuvastatin (CRESTOR) 10 MG tablet Take 1 tablet (10 mg total) by mouth daily.   valACYclovir (VALTREX) 1000 MG tablet Take 1 tablet (1,000 mg total) by mouth 3 (three) times daily.   No facility-administered encounter medications on file as of 04/09/2022.    Past Surgical History:  Procedure Laterality Date   APPENDECTOMY      Family History  Problem Relation Age of Onset   Cancer Mother       Controlled substance contract: n/a     Review of Systems  Constitutional:  Negative for diaphoresis.  Eyes:  Negative for pain.  Respiratory:  Negative for shortness of breath.   Cardiovascular:  Negative for chest pain, palpitations and leg swelling.  Gastrointestinal:  Negative for abdominal pain.  Endocrine: Negative for polydipsia.  Skin:  Negative for rash.  Neurological:  Negative for dizziness, weakness and headaches.  Hematological:  Does not bruise/bleed easily.  All other systems reviewed and are negative.      Objective:   Physical Exam Vitals and nursing note reviewed.  Constitutional:      Appearance: Normal appearance. He is well-developed.  HENT:     Head: Normocephalic.     Nose: Nose normal.     Mouth/Throat:       Mouth: Mucous membranes are moist.     Pharynx: Oropharynx is clear.  Eyes:     Pupils: Pupils are equal, round, and reactive to light.  Neck:     Thyroid: No thyroid mass or thyromegaly.     Vascular: No carotid bruit or JVD.     Trachea: Phonation normal.  Cardiovascular:     Rate and Rhythm: Normal rate and regular rhythm.  Pulmonary:     Effort: Pulmonary effort is normal. No respiratory distress.     Breath sounds: Normal breath sounds.  Abdominal:     General: Bowel sounds are normal.     Palpations: Abdomen is soft.     Tenderness: There is no abdominal tenderness.  Musculoskeletal:        General: Normal range of motion.     Cervical back: Normal range of  motion and neck supple.  Lymphadenopathy:     Cervical: No cervical adenopathy.  Skin:    General: Skin is warm and dry.  Neurological:     Mental Status: He is alert and oriented to person, place, and time.  Psychiatric:        Behavior: Behavior normal.        Thought Content: Thought content normal.        Judgment: Judgment normal.    BP 113/78   Pulse 89   Temp (!) 97.2 F (36.2 C) (Temporal)   Resp 20   Ht 5' 11" (1.803 m)   Wt 221 lb (100.2 kg)   SpO2 95%   BMI 30.82 kg/m           Assessment & Plan:   Steven Mcgee comes in today with chief complaint of Medical Management of Chronic Issues   Diagnosis and orders addressed:  1. Hyperlipidemia associated with type 2 diabetes mellitus (HCC) Loow fat diet - Lipid panel - fenofibrate (TRICOR) 145 MG tablet; Take 1 tablet (145 mg total) by mouth daily.  Dispense: 90 tablet; Refill: 1 - rosuvastatin (CRESTOR) 10 MG tablet; Take 1 tablet (10 mg total) by mouth daily.  Dispense: 90 tablet; Refill: 1  2. Type 2 diabetes mellitus without complication, without long-term current use of insulin (HCC) Stricter carb counting - Bayer DCA Hb A1c Waived - CBC with Differential/Platelet - CMP14+EGFR - Dulaglutide (TRULICITY) 1.5 ZE/0.9QZ SOPN; INJECT 1.5MG INTO THE SKIN ONCE A WEEK  Dispense: 2 mL; Refill: 5 - dapagliflozin propanediol (FARXIGA) 10 MG TABS tablet; Take 1 tablet (10 mg total) by mouth daily.  Dispense: 90 tablet; Refill: 1 - glimepiride (AMARYL) 4 MG tablet; Take 1 tablet (4 mg total) by mouth in the morning and at bedtime.  Dispense: 180 tablet; Refill: 1 - metFORMIN (GLUCOPHAGE) 1000 MG tablet; Take 1 tablet (1,000 mg total) by mouth 2 (two) times daily with a meal.  Dispense: 180 tablet; Refill: 1  3. BMI 32.0-32.9,adult Discussed diet and exercise for person with BMI >25 Will recheck weight in 3-6 months   4. Encounter for Department of Transportation (DOT) examination for driving license  renewal - Urinalysis  5. Microalbuminuria Labs pending - lisinopril (ZESTRIL) 10 MG tablet; Take 1 tablet (10 mg total) by mouth daily.  Dispense: 90 tablet; Refill: 1   Labs pending Health Maintenance reviewed Diet and exercise encouraged  Follow up plan: 3 month   [place], FNP

## 2022-04-09 NOTE — Patient Instructions (Signed)

## 2022-04-10 LAB — CBC WITH DIFFERENTIAL/PLATELET
Basophils Absolute: 0 10*3/uL (ref 0.0–0.2)
Basos: 1 %
EOS (ABSOLUTE): 0.1 10*3/uL (ref 0.0–0.4)
Eos: 1 %
Hematocrit: 47.9 % (ref 37.5–51.0)
Hemoglobin: 16.3 g/dL (ref 13.0–17.7)
Immature Grans (Abs): 0.1 10*3/uL (ref 0.0–0.1)
Immature Granulocytes: 1 %
Lymphocytes Absolute: 2.5 10*3/uL (ref 0.7–3.1)
Lymphs: 38 %
MCH: 30.2 pg (ref 26.6–33.0)
MCHC: 34 g/dL (ref 31.5–35.7)
MCV: 89 fL (ref 79–97)
Monocytes Absolute: 0.5 10*3/uL (ref 0.1–0.9)
Monocytes: 8 %
Neutrophils Absolute: 3.4 10*3/uL (ref 1.4–7.0)
Neutrophils: 51 %
Platelets: 304 10*3/uL (ref 150–450)
RBC: 5.39 x10E6/uL (ref 4.14–5.80)
RDW: 12.8 % (ref 11.6–15.4)
WBC: 6.6 10*3/uL (ref 3.4–10.8)

## 2022-04-10 LAB — CMP14+EGFR
ALT: 22 IU/L (ref 0–44)
AST: 19 IU/L (ref 0–40)
Albumin/Globulin Ratio: 1.9 (ref 1.2–2.2)
Albumin: 4.9 g/dL (ref 3.8–4.9)
Alkaline Phosphatase: 43 IU/L — ABNORMAL LOW (ref 44–121)
BUN/Creatinine Ratio: 13 (ref 9–20)
BUN: 17 mg/dL (ref 6–24)
Bilirubin Total: 0.4 mg/dL (ref 0.0–1.2)
CO2: 23 mmol/L (ref 20–29)
Calcium: 9.7 mg/dL (ref 8.7–10.2)
Chloride: 101 mmol/L (ref 96–106)
Creatinine, Ser: 1.36 mg/dL — ABNORMAL HIGH (ref 0.76–1.27)
Globulin, Total: 2.6 g/dL (ref 1.5–4.5)
Glucose: 249 mg/dL — ABNORMAL HIGH (ref 70–99)
Potassium: 4.4 mmol/L (ref 3.5–5.2)
Sodium: 139 mmol/L (ref 134–144)
Total Protein: 7.5 g/dL (ref 6.0–8.5)
eGFR: 62 mL/min/{1.73_m2} (ref >59)

## 2022-04-10 LAB — LIPID PANEL
Chol/HDL Ratio: 3.7 ratio (ref 0.0–5.0)
Cholesterol, Total: 133 mg/dL (ref 100–199)
HDL: 36 mg/dL — ABNORMAL LOW (ref >39)
LDL Chol Calc (NIH): 60 mg/dL (ref 0–99)
Triglycerides: 231 mg/dL — ABNORMAL HIGH (ref 0–149)
VLDL Cholesterol Cal: 37 mg/dL (ref 5–40)

## 2022-07-12 ENCOUNTER — Ambulatory Visit: Payer: BC Managed Care – PPO | Admitting: Nurse Practitioner

## 2022-07-12 ENCOUNTER — Encounter: Payer: Self-pay | Admitting: Nurse Practitioner

## 2022-07-12 VITALS — BP 105/73 | HR 98 | Temp 97.8°F | Resp 20 | Ht 71.0 in | Wt 221.0 lb

## 2022-07-12 DIAGNOSIS — E785 Hyperlipidemia, unspecified: Secondary | ICD-10-CM

## 2022-07-12 DIAGNOSIS — Z6832 Body mass index (BMI) 32.0-32.9, adult: Secondary | ICD-10-CM | POA: Diagnosis not present

## 2022-07-12 DIAGNOSIS — E119 Type 2 diabetes mellitus without complications: Secondary | ICD-10-CM | POA: Diagnosis not present

## 2022-07-12 DIAGNOSIS — E1169 Type 2 diabetes mellitus with other specified complication: Secondary | ICD-10-CM | POA: Diagnosis not present

## 2022-07-12 LAB — BAYER DCA HB A1C WAIVED: HB A1C (BAYER DCA - WAIVED): 9.1 % — ABNORMAL HIGH (ref 4.8–5.6)

## 2022-07-12 MED ORDER — TRULICITY 1.5 MG/0.5ML ~~LOC~~ SOAJ: SUBCUTANEOUS | 5 refills | Status: DC

## 2022-07-12 NOTE — Progress Notes (Signed)
Subjective:    Patient ID: Steven Mcgee, male    DOB: [birth year], 52 y.o.   MRN: [id number]   Chief Complaint: medical management of chronic issues     HPI:  Steven Mcgee is a 52 y.o. who identifies as a male who was assigned male at birth.   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 Hyperlipidemia associated with type 2 diabetes mellitus ([place]) Does not watch diet and does no dedicated exercise. Lab Results  Component Value Date   CHOL 133 04/09/2022   HDL 36 (L) 04/09/2022   LDLCALC 60 04/09/2022   TRIG 231 (H) 04/09/2022   CHOLHDL 3.7 04/09/2022     2. Type 2 diabetes mellitus without complication, without long-term current use of insulin (HCC) Fasting blood sugars vary from day to day. He really does not watch carbs in diet at all. No changes were made to meds at last visit, he want ed to try and do diet control. Lab Results  Component Value Date   HGBA1C 8.3 (H) 04/09/2022     3. BMI 32.0-32.9,adult No recent weight changes Wt Readings from Last 3 Encounters:  07/12/22 221 lb (100.2 kg)  04/09/22 221 lb (100.2 kg)  01/01/22 225 lb (102.1 kg)   BMI Readings from Last 3 Encounters:  07/12/22 30.82 kg/m  04/09/22 30.82 kg/m  01/01/22 31.38 kg/m     New complaints: None today  Allergies  Allergen Reactions   Sulfa Antibiotics    Outpatient Encounter Medications as of 07/12/2022  Medication Sig   dapagliflozin propanediol (FARXIGA) 10 MG TABS tablet Take 1 tablet (10 mg total) by mouth daily.   Dulaglutide (TRULICITY) 1.5 VO/1.6WV SOPN INJECT 1.5MG  INTO THE SKIN ONCE A WEEK   fenofibrate (TRICOR) 145 MG tablet Take 1 tablet (145 mg total) by mouth daily.   glimepiride (AMARYL) 4 MG tablet Take 1 tablet (4 mg total) by mouth in the morning and at bedtime.   ketoconazole (NIZORAL) 200 MG tablet Take 1  tablet (200 mg total) by mouth daily.   lisinopril (ZESTRIL) 10 MG tablet Take 1 tablet (10 mg total) by mouth daily.   metFORMIN (GLUCOPHAGE) 1000 MG tablet Take 1 tablet (1,000 mg total) by mouth 2 (two) times daily with a meal.   rosuvastatin (CRESTOR) 10 MG tablet Take 1 tablet (10 mg total) by mouth daily.   valACYclovir (VALTREX) 1000 MG tablet Take 1 tablet (1,000 mg total) by mouth 3 (three) times daily.   No facility-administered encounter medications on file as of 07/12/2022.    Past Surgical History:  Procedure Laterality Date   APPENDECTOMY      Family History  Problem Relation Age of Onset   Cancer Mother       Controlled substance contract: n/a     Review of Systems  Constitutional:  Negative for diaphoresis.  Eyes:  Negative for pain.  Respiratory:  Negative for shortness of breath.   Cardiovascular:  Negative for chest pain, palpitations and leg swelling.  Gastrointestinal:  Negative for abdominal pain.  Endocrine: Negative for polydipsia.  Skin:  Negative for rash.  Neurological:  Negative for dizziness, weakness and headaches.  Hematological:  Does not bruise/bleed easily.  All other systems reviewed and are negative.      Objective:   Physical Exam Vitals and nursing note reviewed.  Constitutional:  Appearance: Normal appearance. He is well-developed.  HENT:     Head: Normocephalic.     Nose: Nose normal.     Mouth/Throat:     Mouth: Mucous membranes are moist.     Pharynx: Oropharynx is clear.  Eyes:     Pupils: Pupils are equal, round, and reactive to light.  Neck:     Thyroid: No thyroid mass or thyromegaly.     Vascular: No carotid bruit or JVD.     Trachea: Phonation normal.  Cardiovascular:     Rate and Rhythm: Normal rate and regular rhythm.  Pulmonary:     Effort: Pulmonary effort is normal. No respiratory distress.     Breath sounds: Normal breath sounds.  Abdominal:     General: Bowel sounds are normal.     Palpations:  Abdomen is soft.     Tenderness: There is no abdominal tenderness.  Musculoskeletal:        General: Normal range of motion.     Cervical back: Normal range of motion and neck supple.  Lymphadenopathy:     Cervical: No cervical adenopathy.  Skin:    General: Skin is warm and dry.  Neurological:     Mental Status: He is alert and oriented to person, place, and time.  Psychiatric:        Behavior: Behavior normal.        Thought Content: Thought content normal.        Judgment: Judgment normal.    BP 105/73   Pulse 98   Temp 97.8 F (36.6 C) (Temporal)   Resp 20   Ht 5\' 11"  (1.803 m)   Wt 221 lb (100.2 kg)   SpO2 95%   BMI 30.82 kg/m         Assessment & Plan:   Steven Mcgee comes in today with chief complaint of Medical Management of Chronic Issues   Diagnosis and orders addressed:  1. Hyperlipidemia associated with type 2 diabetes mellitus (HCC) Low fat diet - Lipid panel  2. Type 2 diabetes mellitus without complication, without long-term current use of insulin (HCC) Stricter carb counting He does not want medication changes - Bayer DCA Hb A1c Waived - CBC with Differential/Platelet - CMP14+EGFR - Dulaglutide (TRULICITY) 1.5 SA/6.3KZ SOPN; INJECT 1.5MG  INTO THE SKIN ONCE A WEEK  Dispense: 2 mL; Refill: 5  3. BMI 32.0-32.9,adult Discussed diet and exercise for person with BMI >25 Will recheck weight in 3-6 months    Labs pending Health Maintenance reviewed Diet and exercise encouraged  Follow up plan: 3 months   [name] Hassell Done, FNP

## 2022-07-13 LAB — LIPID PANEL
Chol/HDL Ratio: 3.7 ratio (ref 0.0–5.0)
Cholesterol, Total: 143 mg/dL (ref 100–199)
HDL: 39 mg/dL — ABNORMAL LOW (ref >39)
LDL Chol Calc (NIH): 57 mg/dL (ref 0–99)
Triglycerides: 305 mg/dL — ABNORMAL HIGH (ref 0–149)
VLDL Cholesterol Cal: 47 mg/dL — ABNORMAL HIGH (ref 5–40)

## 2022-07-13 LAB — CMP14+EGFR
ALT: 27 IU/L (ref 0–44)
AST: 22 IU/L (ref 0–40)
Albumin/Globulin Ratio: 2 (ref 1.2–2.2)
Albumin: 5 g/dL — ABNORMAL HIGH (ref 3.8–4.9)
Alkaline Phosphatase: 45 IU/L (ref 44–121)
BUN/Creatinine Ratio: 15 (ref 9–20)
BUN: 25 mg/dL — ABNORMAL HIGH (ref 6–24)
Bilirubin Total: 0.5 mg/dL (ref 0.0–1.2)
CO2: 21 mmol/L (ref 20–29)
Calcium: 10.5 mg/dL — ABNORMAL HIGH (ref 8.7–10.2)
Chloride: 97 mmol/L (ref 96–106)
Creatinine, Ser: 1.63 mg/dL — ABNORMAL HIGH (ref 0.76–1.27)
Globulin, Total: 2.5 g/dL (ref 1.5–4.5)
Glucose: 205 mg/dL — ABNORMAL HIGH (ref 70–99)
Potassium: 4.5 mmol/L (ref 3.5–5.2)
Sodium: 137 mmol/L (ref 134–144)
Total Protein: 7.5 g/dL (ref 6.0–8.5)
eGFR: 50 mL/min/{1.73_m2} — ABNORMAL LOW (ref >59)

## 2022-07-13 LAB — CBC WITH DIFFERENTIAL/PLATELET
Basophils Absolute: 0.1 10*3/uL (ref 0.0–0.2)
Basos: 1 %
EOS (ABSOLUTE): 0.1 10*3/uL (ref 0.0–0.4)
Eos: 1 %
Hematocrit: 49.3 % (ref 37.5–51.0)
Hemoglobin: 16.7 g/dL (ref 13.0–17.7)
Immature Grans (Abs): 0 10*3/uL (ref 0.0–0.1)
Immature Granulocytes: 0 %
Lymphocytes Absolute: 2.4 10*3/uL (ref 0.7–3.1)
Lymphs: 29 %
MCH: 29.8 pg (ref 26.6–33.0)
MCHC: 33.9 g/dL (ref 31.5–35.7)
MCV: 88 fL (ref 79–97)
Monocytes Absolute: 0.6 10*3/uL (ref 0.1–0.9)
Monocytes: 7 %
Neutrophils Absolute: 5.2 10*3/uL (ref 1.4–7.0)
Neutrophils: 62 %
Platelets: 312 10*3/uL (ref 150–450)
RBC: 5.61 x10E6/uL (ref 4.14–5.80)
RDW: 12.6 % (ref 11.6–15.4)
WBC: 8.3 10*3/uL (ref 3.4–10.8)

## 2022-09-24 DIAGNOSIS — L255 Unspecified contact dermatitis due to plants, except food: Secondary | ICD-10-CM | POA: Diagnosis not present

## 2022-10-11 ENCOUNTER — Encounter: Payer: Self-pay | Admitting: Nurse Practitioner

## 2022-10-11 ENCOUNTER — Ambulatory Visit: Payer: BC Managed Care – PPO | Admitting: Nurse Practitioner

## 2022-10-11 VITALS — BP 114/76 | HR 70 | Temp 97.5°F | Resp 20 | Ht 71.0 in | Wt 223.0 lb

## 2022-10-11 DIAGNOSIS — Z7984 Long term (current) use of oral hypoglycemic drugs: Secondary | ICD-10-CM

## 2022-10-11 DIAGNOSIS — E785 Hyperlipidemia, unspecified: Secondary | ICD-10-CM

## 2022-10-11 DIAGNOSIS — E119 Type 2 diabetes mellitus without complications: Secondary | ICD-10-CM | POA: Diagnosis not present

## 2022-10-11 DIAGNOSIS — R809 Proteinuria, unspecified: Secondary | ICD-10-CM

## 2022-10-11 DIAGNOSIS — Z6832 Body mass index (BMI) 32.0-32.9, adult: Secondary | ICD-10-CM | POA: Diagnosis not present

## 2022-10-11 DIAGNOSIS — E1169 Type 2 diabetes mellitus with other specified complication: Secondary | ICD-10-CM | POA: Diagnosis not present

## 2022-10-11 DIAGNOSIS — Z7985 Long-term (current) use of injectable non-insulin antidiabetic drugs: Secondary | ICD-10-CM

## 2022-10-11 LAB — BAYER DCA HB A1C WAIVED: HB A1C (BAYER DCA - WAIVED): 8.6 % — ABNORMAL HIGH (ref 4.8–5.6)

## 2022-10-11 MED ORDER — FENOFIBRATE 145 MG PO TABS: 145.0000 mg | ORAL_TABLET | Freq: Every day | ORAL | 1 refills | Status: DC

## 2022-10-11 MED ORDER — TRULICITY 1.5 MG/0.5ML ~~LOC~~ SOAJ: SUBCUTANEOUS | 5 refills | Status: DC

## 2022-10-11 MED ORDER — LISINOPRIL 10 MG PO TABS: 10.0000 mg | ORAL_TABLET | Freq: Every day | ORAL | 1 refills | Status: DC

## 2022-10-11 MED ORDER — GLIMEPIRIDE 4 MG PO TABS
4.0000 mg | ORAL_TABLET | Freq: Two times a day (BID) | ORAL | 1 refills | Status: DC
Start: 2022-10-11 — End: 2023-01-20

## 2022-10-11 MED ORDER — METFORMIN HCL 1000 MG PO TABS
1000.0000 mg | ORAL_TABLET | Freq: Two times a day (BID) | ORAL | 1 refills | Status: DC
Start: 2022-10-11 — End: 2023-01-20

## 2022-10-11 MED ORDER — ROSUVASTATIN CALCIUM 10 MG PO TABS: 10.0000 mg | ORAL_TABLET | Freq: Every day | ORAL | 1 refills | Status: DC

## 2022-10-11 MED ORDER — DAPAGLIFLOZIN PROPANEDIOL 10 MG PO TABS
10.0000 mg | ORAL_TABLET | Freq: Every day | ORAL | 1 refills | Status: DC
Start: 2022-10-11 — End: 2023-01-20

## 2022-10-11 NOTE — Progress Notes (Signed)
Subjective:    Patient ID: Steven Mcgee, male    DOB: [birth year], 52 y.o.   MRN: [id number]   Chief Complaint: Medical Management of Chronic Issues    HPI:  Steven Mcgee is a 52 y.o. who identifies as a male who was assigned male at birth.   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 Type 2 diabetes mellitus without complication, without long-term current use of insulin (HCC) Fasting blood sugars are running around Lab Results  Component Value Date   HGBA1C 9.1 (H) 07/12/2022     2. Hyperlipidemia associated with type 2 diabetes mellitus (HCC) Does not watch diet and does no dedicated exercise. Lab Results  Component Value Date   CHOL 143 07/12/2022   HDL 39 (L) 07/12/2022   LDLCALC 57 07/12/2022   TRIG 305 (H) 07/12/2022   CHOLHDL 3.7 07/12/2022     3. BMI 32.0-32.9,adult No recent weight changes Wt Readings from Last 3 Encounters:  10/11/22 223 lb (101.2 kg)  07/12/22 221 lb (100.2 kg)  04/09/22 221 lb (100.2 kg)   BMI Readings from Last 3 Encounters:  10/11/22 31.10 kg/m  07/12/22 30.82 kg/m  04/09/22 30.82 kg/m      New complaints: None today  Allergies  Allergen Reactions   Sulfa Antibiotics    Outpatient Encounter Medications as of 10/11/2022  Medication Sig   dapagliflozin propanediol (FARXIGA) 10 MG TABS tablet Take 1 tablet (10 mg total) by mouth daily.   Dulaglutide (TRULICITY) 1.5 MG/0.5ML SOPN INJECT 1.5MG  INTO THE SKIN ONCE A WEEK   fenofibrate (TRICOR) 145 MG tablet Take 1 tablet (145 mg total) by mouth daily.   glimepiride (AMARYL) 4 MG tablet Take 1 tablet (4 mg total) by mouth in the morning and at bedtime.   ketoconazole (NIZORAL) 200 MG tablet Take 1 tablet (200 mg total) by mouth daily.   lisinopril (ZESTRIL) 10 MG tablet Take 1 tablet (10 mg total) by mouth daily.   metFORMIN (GLUCOPHAGE) 1000  MG tablet Take 1 tablet (1,000 mg total) by mouth 2 (two) times daily with a meal.   rosuvastatin (CRESTOR) 10 MG tablet Take 1 tablet (10 mg total) by mouth daily.   valACYclovir (VALTREX) 1000 MG tablet Take 1 tablet (1,000 mg total) by mouth 3 (three) times daily.   No facility-administered encounter medications on file as of 10/11/2022.    Past Surgical History:  Procedure Laterality Date   APPENDECTOMY      Family History  Problem Relation Age of Onset   Cancer Mother       Controlled substance contract: n/a     Review of Systems  Constitutional:  Negative for diaphoresis.  Eyes:  Negative for pain.  Respiratory:  Negative for shortness of breath.   Cardiovascular:  Negative for chest pain, palpitations and leg swelling.  Gastrointestinal:  Negative for abdominal pain.  Endocrine: Negative for polydipsia.  Skin:  Negative for rash.  Neurological:  Negative for dizziness, weakness and headaches.  Hematological:  Does not bruise/bleed easily.  All other systems reviewed and are negative.      Objective:   Physical Exam Vitals and nursing note reviewed.  Constitutional:      Appearance: Normal appearance. He is well-developed.  HENT:     Head: Normocephalic.     Nose: Nose normal.     Mouth/Throat:  Mouth: Mucous membranes are moist.     Pharynx: Oropharynx is clear.  Eyes:     Pupils: Pupils are equal, round, and reactive to light.  Neck:     Thyroid: No thyroid mass or thyromegaly.     Vascular: No carotid bruit or JVD.     Trachea: Phonation normal.  Cardiovascular:     Rate and Rhythm: Normal rate and regular rhythm.  Pulmonary:     Effort: Pulmonary effort is normal. No respiratory distress.     Breath sounds: Normal breath sounds.  Abdominal:     General: Bowel sounds are normal.     Palpations: Abdomen is soft.     Tenderness: There is no abdominal tenderness.  Musculoskeletal:        General: Normal range of motion.     Cervical back:  Normal range of motion and neck supple.  Lymphadenopathy:     Cervical: No cervical adenopathy.  Skin:    General: Skin is warm and dry.  Neurological:     Mental Status: He is alert and oriented to person, place, and time.  Psychiatric:        Behavior: Behavior normal.        Thought Content: Thought content normal.        Judgment: Judgment normal.    BP 114/76   Pulse 70   Temp (!) 97.5 F (36.4 C) (Temporal)   Resp 20   Ht 5\' 11"  (1.803 m)   Wt 223 lb (101.2 kg)   SpO2 96%   BMI 31.10 kg/m   .hgab1c 8.6%       Assessment & Plan:   Steven Mcgee comes in today with chief complaint of Medical Management of Chronic Issues   Diagnosis and orders addressed:  1. Type 2 diabetes mellitus without complication, without long-term current use of insulin (HCC) Hgba1c is improving Continue t watch carbs in diet - Bayer DCA Hb A1c Waived - CBC with Differential/Platelet - CMP14+EGFR - Dulaglutide (TRULICITY) 1.5 MG/0.5ML SOPN; INJECT 1.5MG  INTO THE SKIN ONCE A WEEK  Dispense: 2 mL; Refill: 5 - dapagliflozin propanediol (FARXIGA) 10 MG TABS tablet; Take 1 tablet (10 mg total) by mouth daily.  Dispense: 90 tablet; Refill: 1 - glimepiride (AMARYL) 4 MG tablet; Take 1 tablet (4 mg total) by mouth in the morning and at bedtime.  Dispense: 180 tablet; Refill: 1 - metFORMIN (GLUCOPHAGE) 1000 MG tablet; Take 1 tablet (1,000 mg total) by mouth 2 (two) times daily with a meal.  Dispense: 180 tablet; Refill: 1  2. Hyperlipidemia associated with type 2 diabetes mellitus (HCC) Low fat diet - Lipid panel - fenofibrate (TRICOR) 145 MG tablet; Take 1 tablet (145 mg total) by mouth daily.  Dispense: 90 tablet; Refill: 1 - rosuvastatin (CRESTOR) 10 MG tablet; Take 1 tablet (10 mg total) by mouth daily.  Dispense: 90 tablet; Refill: 1  3. BMI 32.0-32.9,adult Discussed diet and exercise for person with BMI >25 Will recheck weight in 3-6 months   4. Microalbuminuria - lisinopril  (ZESTRIL) 10 MG tablet; Take 1 tablet (10 mg total) by mouth daily.  Dispense: 90 tablet; Refill: 1   Labs pending Health Maintenance reviewed Diet and exercise encouraged  Follow up plan: 3 months   [name] Daphine Deutscher, FNP

## 2022-10-11 NOTE — Patient Instructions (Signed)

## 2022-10-12 LAB — CMP14+EGFR
ALT: 26 IU/L (ref 0–44)
AST: 24 IU/L (ref 0–40)
Albumin/Globulin Ratio: 2.1 (ref 1.2–2.2)
Albumin: 4.8 g/dL (ref 3.8–4.9)
Alkaline Phosphatase: 43 IU/L — ABNORMAL LOW (ref 44–121)
BUN/Creatinine Ratio: 14 (ref 9–20)
BUN: 18 mg/dL (ref 6–24)
Bilirubin Total: 0.3 mg/dL (ref 0.0–1.2)
CO2: 23 mmol/L (ref 20–29)
Calcium: 10 mg/dL (ref 8.7–10.2)
Chloride: 99 mmol/L (ref 96–106)
Creatinine, Ser: 1.28 mg/dL — ABNORMAL HIGH (ref 0.76–1.27)
Globulin, Total: 2.3 g/dL (ref 1.5–4.5)
Glucose: 218 mg/dL — ABNORMAL HIGH (ref 70–99)
Potassium: 4.6 mmol/L (ref 3.5–5.2)
Sodium: 138 mmol/L (ref 134–144)
Total Protein: 7.1 g/dL (ref 6.0–8.5)
eGFR: 67 mL/min/{1.73_m2} (ref >59)

## 2022-10-12 LAB — CBC WITH DIFFERENTIAL/PLATELET
Basophils Absolute: 0.1 10*3/uL (ref 0.0–0.2)
Basos: 1 %
EOS (ABSOLUTE): 0.1 10*3/uL (ref 0.0–0.4)
Eos: 1 %
Hematocrit: 47.6 % (ref 37.5–51.0)
Hemoglobin: 16.1 g/dL (ref 13.0–17.7)
Immature Grans (Abs): 0 10*3/uL (ref 0.0–0.1)
Immature Granulocytes: 0 %
Lymphocytes Absolute: 2.9 10*3/uL (ref 0.7–3.1)
Lymphs: 39 %
MCH: 29.5 pg (ref 26.6–33.0)
MCHC: 33.8 g/dL (ref 31.5–35.7)
MCV: 87 fL (ref 79–97)
Monocytes Absolute: 0.5 10*3/uL (ref 0.1–0.9)
Monocytes: 7 %
Neutrophils Absolute: 3.9 10*3/uL (ref 1.4–7.0)
Neutrophils: 52 %
Platelets: 263 10*3/uL (ref 150–450)
RBC: 5.46 x10E6/uL (ref 4.14–5.80)
RDW: 13.3 % (ref 11.6–15.4)
WBC: 7.5 10*3/uL (ref 3.4–10.8)

## 2022-10-12 LAB — LIPID PANEL
Chol/HDL Ratio: 4 ratio (ref 0.0–5.0)
Cholesterol, Total: 139 mg/dL (ref 100–199)
HDL: 35 mg/dL — ABNORMAL LOW (ref >39)
LDL Chol Calc (NIH): 57 mg/dL (ref 0–99)
Triglycerides: 304 mg/dL — ABNORMAL HIGH (ref 0–149)
VLDL Cholesterol Cal: 47 mg/dL — ABNORMAL HIGH (ref 5–40)

## 2022-11-29 ENCOUNTER — Encounter: Payer: Self-pay | Admitting: *Deleted

## 2023-01-13 ENCOUNTER — Ambulatory Visit: Payer: BC Managed Care – PPO | Admitting: Nurse Practitioner

## 2023-01-20 ENCOUNTER — Encounter: Payer: Self-pay | Admitting: Nurse Practitioner

## 2023-01-20 ENCOUNTER — Ambulatory Visit: Payer: BC Managed Care – PPO | Admitting: Nurse Practitioner

## 2023-01-20 VITALS — BP 119/78 | HR 74 | Temp 97.8°F | Resp 20 | Ht 71.0 in | Wt 221.0 lb

## 2023-01-20 DIAGNOSIS — Z6832 Body mass index (BMI) 32.0-32.9, adult: Secondary | ICD-10-CM

## 2023-01-20 DIAGNOSIS — E119 Type 2 diabetes mellitus without complications: Secondary | ICD-10-CM | POA: Diagnosis not present

## 2023-01-20 DIAGNOSIS — R809 Proteinuria, unspecified: Secondary | ICD-10-CM

## 2023-01-20 DIAGNOSIS — E1169 Type 2 diabetes mellitus with other specified complication: Secondary | ICD-10-CM | POA: Diagnosis not present

## 2023-01-20 DIAGNOSIS — Z7985 Long-term (current) use of injectable non-insulin antidiabetic drugs: Secondary | ICD-10-CM | POA: Diagnosis not present

## 2023-01-20 DIAGNOSIS — Z7984 Long term (current) use of oral hypoglycemic drugs: Secondary | ICD-10-CM | POA: Diagnosis not present

## 2023-01-20 DIAGNOSIS — L237 Allergic contact dermatitis due to plants, except food: Secondary | ICD-10-CM

## 2023-01-20 DIAGNOSIS — E785 Hyperlipidemia, unspecified: Secondary | ICD-10-CM

## 2023-01-20 LAB — BAYER DCA HB A1C WAIVED: HB A1C (BAYER DCA - WAIVED): 8 % — ABNORMAL HIGH (ref 4.8–5.6)

## 2023-01-20 MED ORDER — METHYLPREDNISOLONE ACETATE 80 MG/ML IJ SUSP
80.0000 mg | Freq: Once | INTRAMUSCULAR | Status: AC
Start: 2023-01-20 — End: 2023-01-20
  Administered 2023-01-20: 80 mg via INTRAMUSCULAR

## 2023-01-20 MED ORDER — ROSUVASTATIN CALCIUM 10 MG PO TABS
10.0000 mg | ORAL_TABLET | Freq: Every day | ORAL | 1 refills | Status: DC
Start: 2023-01-20 — End: 2023-07-15

## 2023-01-20 MED ORDER — METFORMIN HCL 1000 MG PO TABS
1000.0000 mg | ORAL_TABLET | Freq: Two times a day (BID) | ORAL | 1 refills | Status: DC
Start: 2023-01-20 — End: 2023-07-15

## 2023-01-20 MED ORDER — GLIMEPIRIDE 4 MG PO TABS
4.0000 mg | ORAL_TABLET | Freq: Two times a day (BID) | ORAL | 1 refills | Status: DC
Start: 2023-01-20 — End: 2023-07-15

## 2023-01-20 MED ORDER — LISINOPRIL 10 MG PO TABS
10.0000 mg | ORAL_TABLET | Freq: Every day | ORAL | 1 refills | Status: DC
Start: 2023-01-20 — End: 2023-07-15

## 2023-01-20 MED ORDER — DAPAGLIFLOZIN PROPANEDIOL 10 MG PO TABS
10.0000 mg | ORAL_TABLET | Freq: Every day | ORAL | 1 refills | Status: DC
Start: 2023-01-20 — End: 2023-07-15

## 2023-01-20 MED ORDER — TRULICITY 1.5 MG/0.5ML ~~LOC~~ SOAJ
SUBCUTANEOUS | 5 refills | Status: DC
Start: 2023-01-20 — End: 2023-07-15

## 2023-01-20 MED ORDER — FENOFIBRATE 145 MG PO TABS
145.0000 mg | ORAL_TABLET | Freq: Every day | ORAL | 1 refills | Status: DC
Start: 2023-01-20 — End: 2023-07-15

## 2023-01-20 NOTE — Progress Notes (Signed)
Subjective:    Patient ID: Steven Mcgee, male    DOB: 10/28/1970, 52 y.o.   MRN: [id number]   Chief Complaint: medical management of chronic issues     HPI:  Steven Mcgee is a 52 y.o. who identifies as a male who was assigned male at birth.   Social history: Lives with: girlfriend Work history: hauls lumbar   Comes in today for follow up of the following chronic medical issues:  1. Hyperlipidemia associated with type 2 diabetes mellitus (HCC) Does not really watch diet and does no dedicated exercise Lab Results  Component Value Date   CHOL 139 10/11/2022   HDL 35 (L) 10/11/2022   LDLCALC 57 10/11/2022   TRIG 304 (H) 10/11/2022   CHOLHDL 4.0 10/11/2022     2. Diabetes mellitus treated with oral medication (HCC) He does not check his blood sugars very often Lab Results  Component Value Date   HGBA1C 8.6 (H) 10/11/2022     3. Long-term current use of injectable noninsulin antidiabetic medication  4. BMI 32.0-32.9,adult No recent weight changes Wt Readings from Last 3 Encounters:  01/20/23 221 lb (100.2 kg)  10/11/22 223 lb (101.2 kg)  07/12/22 221 lb (100.2 kg)   BMI Readings from Last 3 Encounters:  01/20/23 30.82 kg/m  10/11/22 31.10 kg/m  07/12/22 30.82 kg/m     New complaints: Rash bil forearms- has been doing yard work and got into poison [employer]  Allergies  Allergen Reactions   Sulfa Antibiotics    Outpatient Encounter Medications as of 01/20/2023  Medication Sig   dapagliflozin propanediol (FARXIGA) 10 MG TABS tablet Take 1 tablet (10 mg total) by mouth daily.   Dulaglutide (TRULICITY) 1.5 MG/0.5ML SOPN INJECT 1.5MG  INTO THE SKIN ONCE A WEEK   fenofibrate (TRICOR) 145 MG tablet Take 1 tablet (145 mg total) by mouth daily.   glimepiride (AMARYL) 4 MG tablet Take 1 tablet (4 mg total) by mouth in the morning and at bedtime.   ketoconazole (NIZORAL) 200 MG tablet Take 1 tablet (200 mg total) by mouth daily.   lisinopril (ZESTRIL) 10 MG  tablet Take 1 tablet (10 mg total) by mouth daily.   metFORMIN (GLUCOPHAGE) 1000 MG tablet Take 1 tablet (1,000 mg total) by mouth 2 (two) times daily with a meal.   rosuvastatin (CRESTOR) 10 MG tablet Take 1 tablet (10 mg total) by mouth daily.   valACYclovir (VALTREX) 1000 MG tablet Take 1 tablet (1,000 mg total) by mouth 3 (three) times daily.   No facility-administered encounter medications on file as of 01/20/2023.    Past Surgical History:  Procedure Laterality Date   APPENDECTOMY      Family History  Problem Relation Age of Onset   Cancer Mother       Controlled substance contract: n/a     Review of Systems  Constitutional:  Negative for diaphoresis.  Eyes:  Negative for pain.  Respiratory:  Negative for shortness of breath.   Cardiovascular:  Negative for chest pain, palpitations and leg swelling.  Gastrointestinal:  Negative for abdominal pain.  Endocrine: Negative for polydipsia.  Skin:  Negative for rash.  Neurological:  Negative for dizziness, weakness and headaches.  Hematological:  Does not bruise/bleed easily.  All other systems reviewed and are negative.      Objective:   Physical Exam Vitals and nursing note reviewed.  Constitutional:      Appearance: Normal appearance. He is well-developed.  HENT:     Head: Normocephalic.  Nose: Nose normal.     Mouth/Throat:     Mouth: Mucous membranes are moist.     Pharynx: Oropharynx is clear.  Eyes:     Pupils: Pupils are equal, round, and reactive to light.  Neck:     Thyroid: No thyroid mass or thyromegaly.     Vascular: No carotid bruit or JVD.     Trachea: Phonation normal.  Cardiovascular:     Rate and Rhythm: Normal rate and regular rhythm.  Pulmonary:     Effort: Pulmonary effort is normal. No respiratory distress.     Breath sounds: Normal breath sounds.  Abdominal:     General: Bowel sounds are normal.     Palpations: Abdomen is soft.     Tenderness: There is no abdominal tenderness.   Musculoskeletal:        General: Normal range of motion.     Cervical back: Normal range of motion and neck supple.  Lymphadenopathy:     Cervical: No cervical adenopathy.  Skin:    General: Skin is warm and dry.     Comments: Erythematous vesicular liner rash on bil forearms  Neurological:     Mental Status: He is alert and oriented to person, place, and time.  Psychiatric:        Behavior: Behavior normal.        Thought Content: Thought content normal.        Judgment: Judgment normal.     BP 119/78   Pulse 74   Temp 97.8 F (36.6 C) (Temporal)   Resp 20   Ht 5\' 11"  (1.803 m)   Wt 221 lb (100.2 kg)   SpO2 95%   BMI 30.82 kg/m   Hgba1c 8.0%     Assessment & Plan:  Meko Hartfiel comes in today with chief complaint of Medical Management of Chronic Issues   Diagnosis and orders addressed:  1. Hyperlipidemia associated with type 2 diabetes mellitus (HCC) Low fat diet - Lipid panel - fenofibrate (TRICOR) 145 MG tablet; Take 1 tablet (145 mg total) by mouth daily.  Dispense: 90 tablet; Refill: 1 - rosuvastatin (CRESTOR) 10 MG tablet; Take 1 tablet (10 mg total) by mouth daily.  Dispense: 90 tablet; Refill: 1  2. Diabetes mellitus treated with oral medication (HCC) Stricter carb counting - Bayer DCA Hb A1c Waived - CBC with Differential/Platelet - CMP14+EGFR - Microalbumin / creatinine urine ratio  3. Long-term current use of injectable noninsulin antidiabetic medication  4. BMI 32.0-32.9,adult Discussed diet and exercise for person with BMI >25 Will recheck weight in 3-6 months   5. Allergic contact dermatitis due to plants, except food Avoid scratching Cool compresses - methylPREDNISolone acetate (DEPO-MEDROL) injection 80 mg  6. Microalbuminuria - lisinopril (ZESTRIL) 10 MG tablet; Take 1 tablet (10 mg total) by mouth daily.  Dispense: 90 tablet; Refill: 1  7. Type 2 diabetes mellitus without complication, without long-term current use of insulin  (HCC) - dapagliflozin propanediol (FARXIGA) 10 MG TABS tablet; Take 1 tablet (10 mg total) by mouth daily.  Dispense: 90 tablet; Refill: 1 - Dulaglutide (TRULICITY) 1.5 MG/0.5ML SOPN; INJECT 1.5MG  INTO THE SKIN ONCE A WEEK  Dispense: 2 mL; Refill: 5 - glimepiride (AMARYL) 4 MG tablet; Take 1 tablet (4 mg total) by mouth in the morning and at bedtime.  Dispense: 180 tablet; Refill: 1 - metFORMIN (GLUCOPHAGE) 1000 MG tablet; Take 1 tablet (1,000 mg total) by mouth 2 (two) times daily with a meal.  Dispense: 180 tablet; Refill:  1   Labs pending Health Maintenance reviewed Diet and exercise encouraged  Follow up plan: 3 month   [name] Daphine Deutscher, FNP

## 2023-01-21 LAB — LIPID PANEL
Chol/HDL Ratio: 3.7 ratio (ref 0.0–5.0)
Cholesterol, Total: 127 mg/dL (ref 100–199)
HDL: 34 mg/dL — ABNORMAL LOW (ref >39)
LDL Chol Calc (NIH): 41 mg/dL (ref 0–99)
Triglycerides: 347 mg/dL — ABNORMAL HIGH (ref 0–149)
VLDL Cholesterol Cal: 52 mg/dL — ABNORMAL HIGH (ref 5–40)

## 2023-01-21 LAB — CMP14+EGFR
ALT: 25 IU/L (ref 0–44)
AST: 20 IU/L (ref 0–40)
Albumin: 5 g/dL — ABNORMAL HIGH (ref 3.8–4.9)
Alkaline Phosphatase: 42 IU/L — ABNORMAL LOW (ref 44–121)
BUN/Creatinine Ratio: 15 (ref 9–20)
BUN: 20 mg/dL (ref 6–24)
Bilirubin Total: 0.4 mg/dL (ref 0.0–1.2)
CO2: 22 mmol/L (ref 20–29)
Calcium: 9.7 mg/dL (ref 8.7–10.2)
Chloride: 99 mmol/L (ref 96–106)
Creatinine, Ser: 1.31 mg/dL — ABNORMAL HIGH (ref 0.76–1.27)
Globulin, Total: 2.4 g/dL (ref 1.5–4.5)
Glucose: 246 mg/dL — ABNORMAL HIGH (ref 70–99)
Potassium: 4.7 mmol/L (ref 3.5–5.2)
Sodium: 136 mmol/L (ref 134–144)
Total Protein: 7.4 g/dL (ref 6.0–8.5)
eGFR: 65 mL/min/{1.73_m2} (ref >59)

## 2023-01-21 LAB — CBC WITH DIFFERENTIAL/PLATELET
Basophils Absolute: 0.1 10*3/uL (ref 0.0–0.2)
Basos: 1 %
EOS (ABSOLUTE): 0.1 10*3/uL (ref 0.0–0.4)
Eos: 1 %
Hematocrit: 49 % (ref 37.5–51.0)
Hemoglobin: 16.4 g/dL (ref 13.0–17.7)
Immature Grans (Abs): 0 10*3/uL (ref 0.0–0.1)
Immature Granulocytes: 0 %
Lymphocytes Absolute: 2.6 10*3/uL (ref 0.7–3.1)
Lymphs: 37 %
MCH: 29.6 pg (ref 26.6–33.0)
MCHC: 33.5 g/dL (ref 31.5–35.7)
MCV: 88 fL (ref 79–97)
Monocytes Absolute: 0.5 10*3/uL (ref 0.1–0.9)
Monocytes: 8 %
Neutrophils Absolute: 3.8 10*3/uL (ref 1.4–7.0)
Neutrophils: 53 %
Platelets: 276 10*3/uL (ref 150–450)
RBC: 5.54 x10E6/uL (ref 4.14–5.80)
RDW: 12.8 % (ref 11.6–15.4)
WBC: 7.1 10*3/uL (ref 3.4–10.8)

## 2023-01-21 LAB — MICROALBUMIN / CREATININE URINE RATIO
Creatinine, Urine: 54.3 mg/dL
Microalb/Creat Ratio: <6 mg/g{creat} (ref 0–29)
Microalbumin, Urine: <3 ug/mL

## 2023-02-16 ENCOUNTER — Telehealth: Payer: Self-pay | Admitting: Nurse Practitioner

## 2023-04-21 ENCOUNTER — Ambulatory Visit: Payer: BC Managed Care – PPO | Admitting: Nurse Practitioner

## 2023-04-21 ENCOUNTER — Encounter: Payer: Self-pay | Admitting: Nurse Practitioner

## 2023-04-21 VITALS — BP 116/79 | HR 87 | Temp 97.8°F | Ht 71.0 in | Wt 223.5 lb

## 2023-04-21 DIAGNOSIS — E785 Hyperlipidemia, unspecified: Secondary | ICD-10-CM

## 2023-04-21 DIAGNOSIS — Z7984 Long term (current) use of oral hypoglycemic drugs: Secondary | ICD-10-CM | POA: Diagnosis not present

## 2023-04-21 DIAGNOSIS — E1169 Type 2 diabetes mellitus with other specified complication: Secondary | ICD-10-CM

## 2023-04-21 DIAGNOSIS — Z6832 Body mass index (BMI) 32.0-32.9, adult: Secondary | ICD-10-CM

## 2023-04-21 DIAGNOSIS — Z7985 Long-term (current) use of injectable non-insulin antidiabetic drugs: Secondary | ICD-10-CM | POA: Diagnosis not present

## 2023-04-21 DIAGNOSIS — E119 Type 2 diabetes mellitus without complications: Secondary | ICD-10-CM

## 2023-04-21 LAB — BAYER DCA HB A1C WAIVED: HB A1C (BAYER DCA - WAIVED): 8.9 % — ABNORMAL HIGH (ref 4.8–5.6)

## 2023-04-21 NOTE — Patient Instructions (Signed)

## 2023-04-21 NOTE — Progress Notes (Signed)
Subjective:    Patient ID: Hashim Sermersheim, male    DOB: 05/02/1971, 52 y.o.   MRN: [id number]   Chief Complaint: medical management of chronic issues     HPI:  Amasa Mckechnie is a 52 y.o. who identifies as a male who was assigned male at birth.   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 Hyperlipidemia associated with type 2 diabetes mellitus (HCC) Does not watch diet and does no dedicated exercise. Lab Results  Component Value Date   CHOL 127 01/20/2023   HDL 34 (L) 01/20/2023   LDLCALC 41 01/20/2023   TRIG 347 (H) 01/20/2023   CHOLHDL 3.7 01/20/2023     2. Diabetes mellitus treated with oral medication [facility]) He will not check his blood sugars Lab Results  Component Value Date   HGBA1C 8.0 (H) 01/20/2023     3. Long-term current use of injectable noninsulin antidiabetic medication Is on trulicity weekly  4. BMI 32.0-32.9,adult No recent weight changes    New complaints: None today  Allergies  Allergen Reactions   Sulfa Antibiotics    Outpatient Encounter Medications as of 04/21/2023  Medication Sig   dapagliflozin propanediol (FARXIGA) 10 MG TABS tablet Take 1 tablet (10 mg total) by mouth daily.   Dulaglutide (TRULICITY) 1.5 MG/0.5ML SOPN INJECT 1.5MG  INTO THE SKIN ONCE A WEEK   fenofibrate (TRICOR) 145 MG tablet Take 1 tablet (145 mg total) by mouth daily.   glimepiride (AMARYL) 4 MG tablet Take 1 tablet (4 mg total) by mouth in the morning and at bedtime.   ketoconazole (NIZORAL) 200 MG tablet Take 1 tablet (200 mg total) by mouth daily.   lisinopril (ZESTRIL) 10 MG tablet Take 1 tablet (10 mg total) by mouth daily.   metFORMIN (GLUCOPHAGE) 1000 MG tablet Take 1 tablet (1,000 mg total) by mouth 2 (two) times daily with a meal.   rosuvastatin (CRESTOR) 10 MG tablet Take 1 tablet (10 mg total) by mouth daily.   valACYclovir (VALTREX) 1000 MG  tablet Take 1 tablet (1,000 mg total) by mouth 3 (three) times daily.   No facility-administered encounter medications on file as of 04/21/2023.    Past Surgical History:  Procedure Laterality Date   APPENDECTOMY      Family History  Problem Relation Age of Onset   Cancer Mother       Controlled substance contract: n/a     Review of Systems  Constitutional:  Negative for diaphoresis.  Eyes:  Negative for pain.  Respiratory:  Negative for shortness of breath.   Cardiovascular:  Negative for chest pain, palpitations and leg swelling.  Gastrointestinal:  Negative for abdominal pain.  Endocrine: Negative for polydipsia.  Skin:  Negative for rash.  Neurological:  Negative for dizziness, weakness and headaches.  Hematological:  Does not bruise/bleed easily.  All other systems reviewed and are negative.      Objective:   Physical Exam Vitals and nursing note reviewed.  Constitutional:      Appearance: Normal appearance. He is well-developed.  HENT:     Head: Normocephalic.     Nose: Nose normal.     Mouth/Throat:     Mouth: Mucous membranes are moist.     Pharynx: Oropharynx is clear.  Eyes:     Pupils: Pupils are equal, round, and reactive to light.  Neck:     Thyroid: No thyroid mass or thyromegaly.  Vascular: No carotid bruit or JVD.     Trachea: Phonation normal.  Cardiovascular:     Rate and Rhythm: Normal rate and regular rhythm.  Pulmonary:     Effort: Pulmonary effort is normal. No respiratory distress.     Breath sounds: Normal breath sounds.  Abdominal:     General: Bowel sounds are normal.     Palpations: Abdomen is soft.     Tenderness: There is no abdominal tenderness.  Musculoskeletal:        General: Normal range of motion.     Cervical back: Normal range of motion and neck supple.  Lymphadenopathy:     Cervical: No cervical adenopathy.  Skin:    General: Skin is warm and dry.  Neurological:     Mental Status: He is alert and oriented  to person, place, and time.  Psychiatric:        Behavior: Behavior normal.        Thought Content: Thought content normal.        Judgment: Judgment normal.     BP 116/79   Pulse 87   Temp 97.8 F (36.6 C) (Skin)   Ht 5\' 11"  (1.803 m)   Wt 223 lb 8 oz (101.4 kg)   BMI 31.17 kg/m    HGBa1c 8.9%     Assessment & Plan:   Romondo York comes in today with chief complaint of No chief complaint on file.   Diagnosis and orders addressed:  1. Hyperlipidemia associated with type 2 diabetes mellitus (HCC) Low fat diet - Lipid panel  2. Diabetes mellitus treated with oral medication (HCC) Stricter carb counting Patient refused medication changes - Bayer DCA Hb A1c Waived - CBC with Differential/Platelet - CMP14+EGFR  3. Long-term current use of injectable noninsulin antidiabetic medication  4. BMI 32.0-32.9,adult Discussed diet and exercise for person with BMI >25 Will recheck weight in 3-6 months    Labs pending Health Maintenance reviewed Diet and exercise encouraged  Follow up plan: 3 months   [name] Daphine Deutscher, FNP

## 2023-04-22 LAB — CBC WITH DIFFERENTIAL/PLATELET
Basophils Absolute: 0 10*3/uL (ref 0.0–0.2)
Basos: 1 %
EOS (ABSOLUTE): 0.1 10*3/uL (ref 0.0–0.4)
Eos: 1 %
Hematocrit: 46.5 % (ref 37.5–51.0)
Hemoglobin: 15.1 g/dL (ref 13.0–17.7)
Immature Grans (Abs): 0 10*3/uL (ref 0.0–0.1)
Immature Granulocytes: 0 %
Lymphocytes Absolute: 2.6 10*3/uL (ref 0.7–3.1)
Lymphs: 40 %
MCH: 29.5 pg (ref 26.6–33.0)
MCHC: 32.5 g/dL (ref 31.5–35.7)
MCV: 91 fL (ref 79–97)
Monocytes Absolute: 0.5 10*3/uL (ref 0.1–0.9)
Monocytes: 8 %
Neutrophils Absolute: 3.3 10*3/uL (ref 1.4–7.0)
Neutrophils: 50 %
Platelets: 255 10*3/uL (ref 150–450)
RBC: 5.11 x10E6/uL (ref 4.14–5.80)
RDW: 13.1 % (ref 11.6–15.4)
WBC: 6.5 10*3/uL (ref 3.4–10.8)

## 2023-04-22 LAB — CMP14+EGFR
ALT: 30 [IU]/L (ref 0–44)
AST: 23 [IU]/L (ref 0–40)
Albumin: 5 g/dL — ABNORMAL HIGH (ref 3.8–4.9)
Alkaline Phosphatase: 44 [IU]/L (ref 44–121)
BUN/Creatinine Ratio: 17 (ref 9–20)
BUN: 23 mg/dL (ref 6–24)
Bilirubin Total: 0.3 mg/dL (ref 0.0–1.2)
CO2: 23 mmol/L (ref 20–29)
Calcium: 10.2 mg/dL (ref 8.7–10.2)
Chloride: 98 mmol/L (ref 96–106)
Creatinine, Ser: 1.37 mg/dL — ABNORMAL HIGH (ref 0.76–1.27)
Globulin, Total: 2.4 g/dL (ref 1.5–4.5)
Glucose: 245 mg/dL — ABNORMAL HIGH (ref 70–99)
Potassium: 4.9 mmol/L (ref 3.5–5.2)
Sodium: 138 mmol/L (ref 134–144)
Total Protein: 7.4 g/dL (ref 6.0–8.5)
eGFR: 61 mL/min/{1.73_m2} (ref >59)

## 2023-04-22 LAB — LIPID PANEL
Chol/HDL Ratio: 3.5 ratio (ref 0.0–5.0)
Cholesterol, Total: 132 mg/dL (ref 100–199)
HDL: 38 mg/dL — ABNORMAL LOW (ref >39)
LDL Chol Calc (NIH): 42 mg/dL (ref 0–99)
Triglycerides: 352 mg/dL — ABNORMAL HIGH (ref 0–149)
VLDL Cholesterol Cal: 52 mg/dL — ABNORMAL HIGH (ref 5–40)

## 2023-06-09 IMAGING — XA DG FLUORO GUIDE NDL PLC/BX
1 series · 1 of 1 positions shown · non-contrast
Comparison: none

CLINICAL DATA: Pain and limited range of motion.

[Series 1: ortho adipose · 1 of 1 slices shown]
[im 1/1]
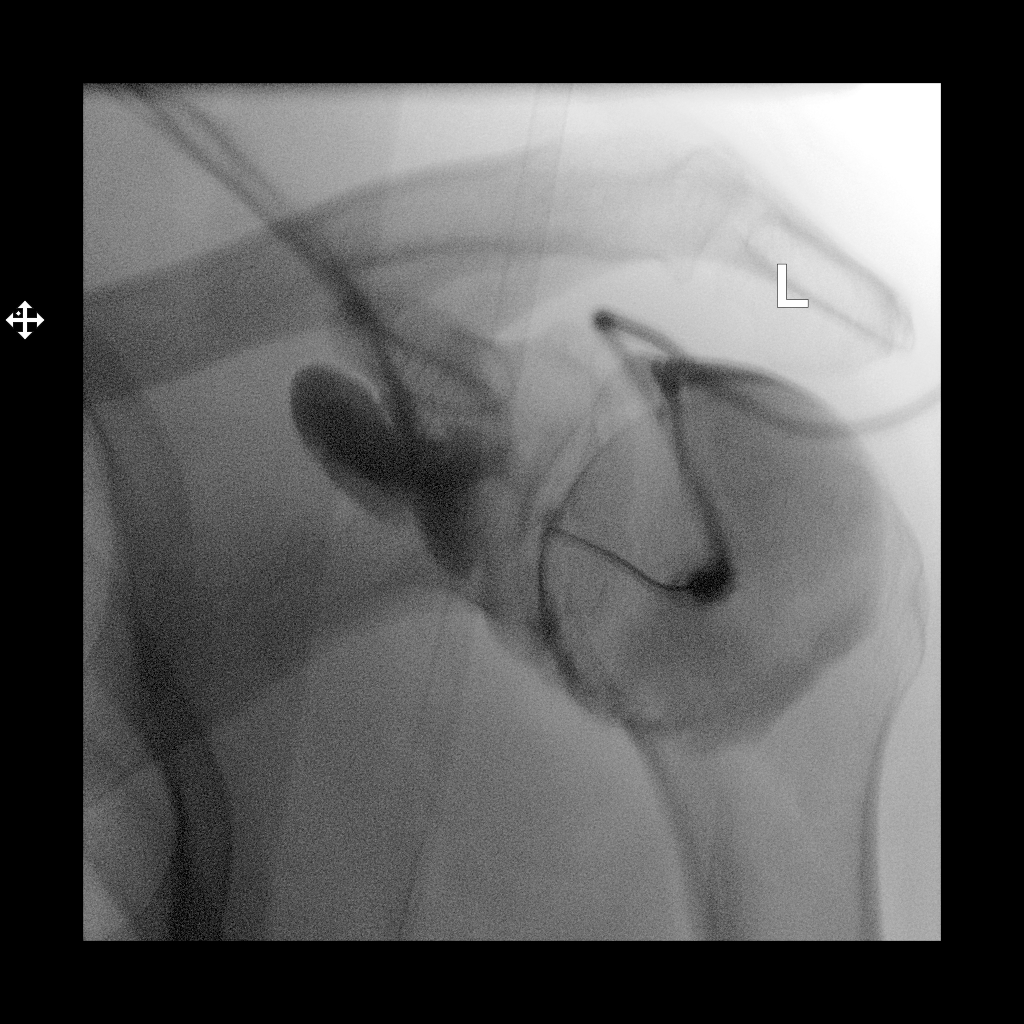

[1 of 1 positions shown; findings below may reference images not displayed]

FLUOROSCOPY TIME:  0 minutes 36 seconds. 39.46 micro gray meter
squared

PROCEDURE:
Left SHOULDER INJECTION UNDER FLUOROSCOPY

The skin overlying the shoulder was scrubbed with Betadine and
draped in sterile fashion. Skin and subcutaneous anesthesia was
carried out using a 25 gauge needle and 1% lidocaine. A 22 gauge
spinal needle was directed under fluoroscopic guidance on one pass
into the glenohumeral joint. 20 cc of a mixture of 0.1 cc MultiHance
and dilute Isovue 200 was then used to fill the glenohumeral joint.
IMPRESSION: Technically successful left shoulder injection for MRI.

## 2023-07-15 ENCOUNTER — Encounter: Payer: Self-pay | Admitting: Nurse Practitioner

## 2023-07-15 ENCOUNTER — Ambulatory Visit: Payer: BC Managed Care – PPO | Admitting: Nurse Practitioner

## 2023-07-15 ENCOUNTER — Ambulatory Visit: Payer: Self-pay | Admitting: Nurse Practitioner

## 2023-07-15 VITALS — BP 112/75 | HR 77 | Temp 98.0°F | Ht 71.0 in | Wt 223.0 lb

## 2023-07-15 DIAGNOSIS — Z7984 Long term (current) use of oral hypoglycemic drugs: Secondary | ICD-10-CM | POA: Diagnosis not present

## 2023-07-15 DIAGNOSIS — E1169 Type 2 diabetes mellitus with other specified complication: Secondary | ICD-10-CM | POA: Diagnosis not present

## 2023-07-15 DIAGNOSIS — E785 Hyperlipidemia, unspecified: Secondary | ICD-10-CM | POA: Diagnosis not present

## 2023-07-15 DIAGNOSIS — E119 Type 2 diabetes mellitus without complications: Secondary | ICD-10-CM | POA: Diagnosis not present

## 2023-07-15 DIAGNOSIS — R809 Proteinuria, unspecified: Secondary | ICD-10-CM

## 2023-07-15 LAB — BAYER DCA HB A1C WAIVED: HB A1C (BAYER DCA - WAIVED): 8.7 % — ABNORMAL HIGH (ref 4.8–5.6)

## 2023-07-15 MED ORDER — TRULICITY 1.5 MG/0.5ML ~~LOC~~ SOAJ: SUBCUTANEOUS | 5 refills | Status: DC

## 2023-07-15 MED ORDER — GLIMEPIRIDE 4 MG PO TABS: 4.0000 mg | ORAL_TABLET | Freq: Two times a day (BID) | ORAL | 1 refills | Status: DC

## 2023-07-15 MED ORDER — ROSUVASTATIN CALCIUM 10 MG PO TABS: 10.0000 mg | ORAL_TABLET | Freq: Every day | ORAL | 1 refills | Status: DC

## 2023-07-15 MED ORDER — METFORMIN HCL 1000 MG PO TABS: 1000.0000 mg | ORAL_TABLET | Freq: Two times a day (BID) | ORAL | 1 refills | Status: DC

## 2023-07-15 MED ORDER — FENOFIBRATE 145 MG PO TABS: 145.0000 mg | ORAL_TABLET | Freq: Every day | ORAL | 1 refills | Status: DC

## 2023-07-15 MED ORDER — LISINOPRIL 10 MG PO TABS: 10.0000 mg | ORAL_TABLET | Freq: Every day | ORAL | 1 refills | Status: DC

## 2023-07-15 MED ORDER — TIRZEPATIDE 5 MG/0.5ML ~~LOC~~ SOAJ: 5.0000 mg | SUBCUTANEOUS | 3 refills | Status: DC

## 2023-07-15 MED ORDER — DAPAGLIFLOZIN PROPANEDIOL 10 MG PO TABS: 10.0000 mg | ORAL_TABLET | Freq: Every day | ORAL | 1 refills | Status: DC

## 2023-07-15 NOTE — Telephone Encounter (Signed)
 Please review

## 2023-07-15 NOTE — Addendum Note (Signed)
 Addended by: Manuella Blackson, [name] on: 07/15/2023 03:41 PM   Modules accepted: Orders

## 2023-07-15 NOTE — Progress Notes (Addendum)
 Subjective:    Patient ID: Steven Mcgee, male    DOB: [birth year], 53 y.o.   MRN: [id number]   Chief Complaint: medical management of chronic issues     HPI:  Steven Mcgee is a 53 y.o. who identifies as a male who was assigned male at birth.   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 Hyperlipidemia associated with type 2 diabetes mellitus (HCC) Does not watch diet and does no dedicated exercise. Lab Results  Component Value Date   CHOL 132 04/21/2023   HDL 38 (L) 04/21/2023   LDLCALC 42 04/21/2023   TRIG 352 (H) 04/21/2023   CHOLHDL 3.5 04/21/2023     2. Diabetes mellitus treated with oral medication [facility]) He will not check his blood sugars Lab Results  Component Value Date   HGBA1C 8.9 (H) 04/21/2023     3. Long-term current use of injectable noninsulin antidiabetic medication Is on trulicity  weekly  4. BMI 32.0-32.9,adult No recent weight changes  Wt Readings from Last 3 Encounters:  07/15/23 223 lb (101.2 kg)  04/21/23 223 lb 8 oz (101.4 kg)  01/20/23 221 lb (100.2 kg)   BMI Readings from Last 3 Encounters:  07/15/23 31.10 kg/m  04/21/23 31.17 kg/m  01/20/23 30.82 kg/m     New complaints: None today  Allergies  Allergen Reactions   Sulfa Antibiotics    Outpatient Encounter Medications as of 07/15/2023  Medication Sig   dapagliflozin  propanediol (FARXIGA ) 10 MG TABS tablet Take 1 tablet (10 mg total) by mouth daily.   Dulaglutide  (TRULICITY ) 1.5 MG/0.5ML SOPN INJECT 1.5MG  INTO THE SKIN ONCE A WEEK   fenofibrate  (TRICOR ) 145 MG tablet Take 1 tablet (145 mg total) by mouth daily.   glimepiride  (AMARYL ) 4 MG tablet Take 1 tablet (4 mg total) by mouth in the morning and at bedtime.   ketoconazole  (NIZORAL ) 200 MG tablet Take 1 tablet (200 mg total) by mouth daily.   lisinopril  (ZESTRIL ) 10 MG tablet Take 1 tablet (10 mg total) by mouth  daily.   metFORMIN  (GLUCOPHAGE ) 1000 MG tablet Take 1 tablet (1,000 mg total) by mouth 2 (two) times daily with a meal.   rosuvastatin  (CRESTOR ) 10 MG tablet Take 1 tablet (10 mg total) by mouth daily.   valACYclovir  (VALTREX ) 1000 MG tablet Take 1 tablet (1,000 mg total) by mouth 3 (three) times daily.   No facility-administered encounter medications on file as of 07/15/2023.    Past Surgical History:  Procedure Laterality Date   APPENDECTOMY      Family History  Problem Relation Age of Onset   Cancer Mother       Controlled substance contract: n/a     Review of Systems  Constitutional:  Negative for diaphoresis.  Eyes:  Negative for pain.  Respiratory:  Negative for shortness of breath.   Cardiovascular:  Negative for chest pain, palpitations and leg swelling.  Gastrointestinal:  Negative for abdominal pain.  Endocrine: Negative for polydipsia.  Skin:  Negative for rash.  Neurological:  Negative for dizziness, weakness and headaches.  Hematological:  Does not bruise/bleed easily.  All other systems reviewed and are negative.      Objective:   Physical Exam Vitals and nursing note reviewed.  Constitutional:      Appearance: Normal appearance. He is well-developed.  HENT:     Head: Normocephalic.     Nose: Nose normal.  Mouth/Throat:     Mouth: Mucous membranes are moist.     Pharynx: Oropharynx is clear.  Eyes:     Pupils: Pupils are equal, round, and reactive to light.  Neck:     Thyroid: No thyroid mass or thyromegaly.     Vascular: No carotid bruit or JVD.     Trachea: Phonation normal.  Cardiovascular:     Rate and Rhythm: Normal rate and regular rhythm.  Pulmonary:     Effort: Pulmonary effort is normal. No respiratory distress.     Breath sounds: Normal breath sounds.  Abdominal:     General: Bowel sounds are normal.     Palpations: Abdomen is soft.     Tenderness: There is no abdominal tenderness.  Musculoskeletal:        General: Normal  range of motion.     Cervical back: Normal range of motion and neck supple.  Lymphadenopathy:     Cervical: No cervical adenopathy.  Skin:    General: Skin is warm and dry.  Neurological:     Mental Status: He is alert and oriented to person, place, and time.  Psychiatric:        Behavior: Behavior normal.        Thought Content: Thought content normal.        Judgment: Judgment normal.     BP 112/75   Pulse 77   Temp 98 F (36.7 C) (Temporal)   Ht 5' 11 (1.803 m)   Wt 223 lb (101.2 kg)   SpO2 95%   BMI 31.10 kg/m     HGBa1c 8.7%     Assessment & Plan:   Steven Mcgee comes in today with chief complaint of No chief complaint on file.   Diagnosis and orders addressed:  1. Hyperlipidemia associated with type 2 diabetes mellitus (HCC) Low fat diet - Lipid panel  2. Diabetes mellitus treated with oral medication (HCC) Stricter carb counting Changed from trulicity  to mounjario 5mg  weekly - Bayer DCA Hb A1c Waived - CBC with Differential/Platelet - CMP14+EGFR  3. Long-term current use of injectable noninsulin antidiabetic medication  4. BMI 32.0-32.9,adult Discussed diet and exercise for person with BMI >25 Will recheck weight in 3-6 months  Meds ordered this encounter  Medications   fenofibrate  (TRICOR ) 145 MG tablet    Sig: Take 1 tablet (145 mg total) by mouth daily.    Dispense:  90 tablet    Refill:  1    Supervising Provider:   DETTINGER, JOSHUA A [[id number]]   rosuvastatin  (CRESTOR ) 10 MG tablet    Sig: Take 1 tablet (10 mg total) by mouth daily.    Dispense:  90 tablet    Refill:  1    Supervising Provider:   DETTINGER, JOSHUA A [[id number]]   lisinopril  (ZESTRIL ) 10 MG tablet    Sig: Take 1 tablet (10 mg total) by mouth daily.    Dispense:  90 tablet    Refill:  1    Supervising Provider:   DETTINGER, JOSHUA A [[id number]]   dapagliflozin  propanediol (FARXIGA ) 10 MG TABS tablet    Sig: Take 1 tablet (10 mg total) by mouth daily.    Dispense:   90 tablet    Refill:  1    Supervising Provider:   MARYANNE CHEW A [[id number]]   Dulaglutide  (TRULICITY ) 1.5 MG/0.5ML SOAJ    Sig: INJECT 1.5MG  INTO THE SKIN ONCE A WEEK    Dispense:  2 mL    Refill:  5  This prescription was filled on 03/28/2021. Any refills authorized will be placed on file.    Supervising Provider:   MARYANNE FONDA LABOR [[id number]]   glimepiride  (AMARYL ) 4 MG tablet    Sig: Take 1 tablet (4 mg total) by mouth in the morning and at bedtime.    Dispense:  180 tablet    Refill:  1    Supervising Provider:   DETTINGER, JOSHUA A [[id number]]   metFORMIN  (GLUCOPHAGE ) 1000 MG tablet    Sig: Take 1 tablet (1,000 mg total) by mouth 2 (two) times daily with a meal.    Dispense:  180 tablet    Refill:  1    Supervising Provider:   MARYANNE FONDA A [[id number]]   tirzepatide  (MOUNJARO ) 5 MG/0.5ML Pen    Sig: Inject 5 mg into the skin once a week. To take the place or trulicity . Last HGBA1c was 8.7%    Dispense:  6 mL    Refill:  3    Supervising Provider:   MARYANNE FONDA A [[id number]]     Labs pending Health Maintenance reviewed Diet and exercise encouraged  Follow up plan: 3 months   [name] Gladis, FNP

## 2023-07-15 NOTE — Telephone Encounter (Signed)
 Chief Complaint: Medication order clarification Symptoms: n/a Frequency: n/a Pertinent Negatives: Patient denies n/a Disposition: [] ED /[] Urgent Care (no appt availability in office) / [] Appointment(In office/virtual)/ []  [facility]/ [] Home Care/ [] Refused Recommended Disposition /[] [facility]/ []  Follow-up with PCP Additional Notes: [facility] calling to request clarification on which medication needs to be filled. They received Trulicity  and Mounjaro . Please call [facility] back [phone]    Copied from CRM [id number]. Topic: Clinical - Medication Question >> Jul 15, 2023  4:20 PM Steven Mcgee wrote: Reason for CRM: [facility] calling want to know which rx should be filled  Dulaglutide  (TRULICITY ) 1.5 MG/0.5ML ortirzepatide (MOUNJARO ) 5 MG/0.5ML Pen Reason for Disposition  [1] Caller requesting NON-URGENT health information AND [2] PCP's office is the best resource  Answer Assessment - Initial Assessment Questions 1. REASON FOR CALL or QUESTION: What is your reason for calling today? or How can I best help you? or What question do you have that I can help answer?     [facility] calling to determine which medication should be filled. States they have Trulicity  and Mounjaro . Please advise.  Protocols used: Information Only Call - No Triage-A-AH

## 2023-07-16 LAB — LIPID PANEL
Chol/HDL Ratio: 3.5 {ratio} (ref 0.0–5.0)
Cholesterol, Total: 122 mg/dL (ref 100–199)
HDL: 35 mg/dL — ABNORMAL LOW (ref >39)
LDL Chol Calc (NIH): 42 mg/dL (ref 0–99)
Triglycerides: 295 mg/dL — ABNORMAL HIGH (ref 0–149)
VLDL Cholesterol Cal: 45 mg/dL — ABNORMAL HIGH (ref 5–40)

## 2023-07-16 LAB — CMP14+EGFR
ALT: 29 [IU]/L (ref 0–44)
AST: 22 [IU]/L (ref 0–40)
Albumin: 4.9 g/dL (ref 3.8–4.9)
Alkaline Phosphatase: 40 [IU]/L — ABNORMAL LOW (ref 44–121)
BUN/Creatinine Ratio: 15 (ref 9–20)
BUN: 19 mg/dL (ref 6–24)
Bilirubin Total: 0.3 mg/dL (ref 0.0–1.2)
CO2: 21 mmol/L (ref 20–29)
Calcium: 9.5 mg/dL (ref 8.7–10.2)
Chloride: 99 mmol/L (ref 96–106)
Creatinine, Ser: 1.29 mg/dL — ABNORMAL HIGH (ref 0.76–1.27)
Globulin, Total: 2.3 g/dL (ref 1.5–4.5)
Glucose: 205 mg/dL — ABNORMAL HIGH (ref 70–99)
Potassium: 4.4 mmol/L (ref 3.5–5.2)
Sodium: 137 mmol/L (ref 134–144)
Total Protein: 7.2 g/dL (ref 6.0–8.5)
eGFR: 66 mL/min/{1.73_m2} (ref >59)

## 2023-07-16 LAB — CBC WITH DIFFERENTIAL/PLATELET
Basophils Absolute: 0 10*3/uL (ref 0.0–0.2)
Basos: 1 %
EOS (ABSOLUTE): 0.1 10*3/uL (ref 0.0–0.4)
Eos: 2 %
Hematocrit: 46.9 % (ref 37.5–51.0)
Hemoglobin: 15.2 g/dL (ref 13.0–17.7)
Immature Grans (Abs): 0 10*3/uL (ref 0.0–0.1)
Immature Granulocytes: 0 %
Lymphocytes Absolute: 2.5 10*3/uL (ref 0.7–3.1)
Lymphs: 38 %
MCH: 29.3 pg (ref 26.6–33.0)
MCHC: 32.4 g/dL (ref 31.5–35.7)
MCV: 91 fL (ref 79–97)
Monocytes Absolute: 0.5 10*3/uL (ref 0.1–0.9)
Monocytes: 8 %
Neutrophils Absolute: 3.4 10*3/uL (ref 1.4–7.0)
Neutrophils: 51 %
Platelets: 263 10*3/uL (ref 150–450)
RBC: 5.18 x10E6/uL (ref 4.14–5.80)
RDW: 12.5 % (ref 11.6–15.4)
WBC: 6.6 10*3/uL (ref 3.4–10.8)

## 2023-07-18 NOTE — Telephone Encounter (Signed)
 Called and left voicemail that we want patient to have mounjaro if patients insurance will cover

## 2023-07-18 NOTE — Telephone Encounter (Signed)
 Please  let pharmacy know we are changing to [facility] if insurance will cover meds.

## 2023-10-14 ENCOUNTER — Encounter (HOSPITAL_COMMUNITY): Payer: Self-pay

## 2023-10-21 ENCOUNTER — Ambulatory Visit: Payer: BC Managed Care – PPO | Admitting: Nurse Practitioner

## 2023-10-21 ENCOUNTER — Encounter: Payer: Self-pay | Admitting: Nurse Practitioner

## 2023-10-21 VITALS — BP 111/73 | HR 78 | Temp 97.7°F | Ht 71.0 in | Wt 217.0 lb

## 2023-10-21 DIAGNOSIS — E119 Type 2 diabetes mellitus without complications: Secondary | ICD-10-CM | POA: Diagnosis not present

## 2023-10-21 DIAGNOSIS — Z6832 Body mass index (BMI) 32.0-32.9, adult: Secondary | ICD-10-CM | POA: Diagnosis not present

## 2023-10-21 DIAGNOSIS — E785 Hyperlipidemia, unspecified: Secondary | ICD-10-CM | POA: Diagnosis not present

## 2023-10-21 DIAGNOSIS — E1169 Type 2 diabetes mellitus with other specified complication: Secondary | ICD-10-CM

## 2023-10-21 DIAGNOSIS — Z7984 Long term (current) use of oral hypoglycemic drugs: Secondary | ICD-10-CM | POA: Diagnosis not present

## 2023-10-21 DIAGNOSIS — Z125 Encounter for screening for malignant neoplasm of prostate: Secondary | ICD-10-CM | POA: Diagnosis not present

## 2023-10-21 DIAGNOSIS — Z7985 Long-term (current) use of injectable non-insulin antidiabetic drugs: Secondary | ICD-10-CM | POA: Diagnosis not present

## 2023-10-21 LAB — BAYER DCA HB A1C WAIVED: HB A1C (BAYER DCA - WAIVED): 8.1 % — ABNORMAL HIGH (ref 4.8–5.6)

## 2023-10-21 MED ORDER — TIRZEPATIDE 7.5 MG/0.5ML ~~LOC~~ SOAJ: 7.5000 mg | SUBCUTANEOUS | 2 refills | Status: DC

## 2023-10-21 NOTE — Patient Instructions (Signed)

## 2023-10-21 NOTE — Progress Notes (Signed)
 Subjective:    Patient ID: Steven Mcgee, male    DOB: 07/04/1970, 53 y.o.   MRN: [id number]   Chief Complaint: medical management of chronic issues     HPI:  Steven Mcgee is a 53 y.o. who identifies as a male who was assigned male at birth.   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 Hyperlipidemia associated with type 2 diabetes mellitus (HCC) Does not watch diet and does no dedicated exercise. Lab Results  Component Value Date   CHOL 122 07/15/2023   HDL 35 (L) 07/15/2023   LDLCALC 42 07/15/2023   TRIG 295 (H) 07/15/2023   CHOLHDL 3.5 07/15/2023     2. Diabetes mellitus treated with oral medication (HCC) He will not check his blood sugars. Changed from trulicty to [facility] at last visit. Lab Results  Component Value Date   HGBA1C 8.7 (H) 07/15/2023     3. Long-term current use of injectable noninsulin antidiabetic medication Is on trulicity  weekly  4. BMI 32.0-32.9,adult Weight is down 6 lbs  Wt Readings from Last 3 Encounters:  10/21/23 217 lb (98.4 kg)  07/15/23 223 lb (101.2 kg)  04/21/23 223 lb 8 oz (101.4 kg)   BMI Readings from Last 3 Encounters:  10/21/23 30.27 kg/m  07/15/23 31.10 kg/m  04/21/23 31.17 kg/m       New complaints: None today  Allergies  Allergen Reactions   Sulfa Antibiotics    Outpatient Encounter Medications as of 10/21/2023  Medication Sig   dapagliflozin  propanediol (FARXIGA ) 10 MG TABS tablet Take 1 tablet (10 mg total) by mouth daily.   Dulaglutide  (TRULICITY ) 1.5 MG/0.5ML SOAJ INJECT 1.5MG  INTO THE SKIN ONCE A WEEK   fenofibrate  (TRICOR ) 145 MG tablet Take 1 tablet (145 mg total) by mouth daily.   glimepiride  (AMARYL ) 4 MG tablet Take 1 tablet (4 mg total) by mouth in the morning and at bedtime.   ketoconazole  (NIZORAL ) 200 MG tablet Take 1 tablet (200 mg total) by mouth daily.   lisinopril   (ZESTRIL ) 10 MG tablet Take 1 tablet (10 mg total) by mouth daily.   metFORMIN  (GLUCOPHAGE ) 1000 MG tablet Take 1 tablet (1,000 mg total) by mouth 2 (two) times daily with a meal.   rosuvastatin  (CRESTOR ) 10 MG tablet Take 1 tablet (10 mg total) by mouth daily.   tirzepatide (MOUNJARO) 5 MG/0.5ML Pen Inject 5 mg into the skin once a week. To take the place or trulicity . Last HGBA1c was 8.7%   valACYclovir  (VALTREX ) 1000 MG tablet Take 1 tablet (1,000 mg total) by mouth 3 (three) times daily.   No facility-administered encounter medications on file as of 10/21/2023.    Past Surgical History:  Procedure Laterality Date   APPENDECTOMY      Family History  Problem Relation Age of Onset   Cancer Mother       Controlled substance contract: n/a     Review of Systems  Constitutional:  Negative for diaphoresis.  Eyes:  Negative for pain.  Respiratory:  Negative for shortness of breath.   Cardiovascular:  Negative for chest pain, palpitations and leg swelling.  Gastrointestinal:  Negative for abdominal pain.  Endocrine: Negative for polydipsia.  Skin:  Negative for rash.  Neurological:  Negative for dizziness, weakness and headaches.  Hematological:  Does not bruise/bleed easily.  All other systems reviewed and are negative.      Objective:  Physical Exam Vitals and nursing note reviewed.  Constitutional:      Appearance: Normal appearance. He is well-developed.  HENT:     Head: Normocephalic.     Nose: Nose normal.     Mouth/Throat:     Mouth: Mucous membranes are moist.     Pharynx: Oropharynx is clear.  Eyes:     Pupils: Pupils are equal, round, and reactive to light.  Neck:     Thyroid: No thyroid mass or thyromegaly.     Vascular: No carotid bruit or JVD.     Trachea: Phonation normal.  Cardiovascular:     Rate and Rhythm: Normal rate and regular rhythm.  Pulmonary:     Effort: Pulmonary effort is normal. No respiratory distress.     Breath sounds: Normal  breath sounds.  Abdominal:     General: Bowel sounds are normal.     Palpations: Abdomen is soft.     Tenderness: There is no abdominal tenderness.  Musculoskeletal:        General: Normal range of motion.     Cervical back: Normal range of motion and neck supple.  Lymphadenopathy:     Cervical: No cervical adenopathy.  Skin:    General: Skin is warm and dry.  Neurological:     Mental Status: He is alert and oriented to person, place, and time.  Psychiatric:        Behavior: Behavior normal.        Thought Content: Thought content normal.        Judgment: Judgment normal.    BP 111/73   Pulse 78   Temp 97.7 F (36.5 C) (Temporal)   Ht 5\' 11"  (1.803 m)   Wt 217 lb (98.4 kg)   SpO2 95%   BMI 30.27 kg/m      HGBa1c 8.1%     Assessment & Plan:  Steven Mcgee in today with chief complaint of Medical Management of Chronic Issues   1. Hyperlipidemia associated with type 2 diabetes mellitus (HCC) (Primary) Low fat diet - Lipid panel  2. Diabetes mellitus treated with oral medication (HCC) Stricter carb counting Moujario 7.5mg  weekly - Bayer DCA Hb A1c Waived - CBC with Differential/Platelet - CMP14+EGFR - Vitamin B12  3. Long-term current use of injectable noninsulin antidiabetic medication  4. BMI 32.0-32.9,adult Discussed diet and exercise for person with BMI >25 Will recheck weight in 3-6 months   The above assessment and management plan was discussed with the patient. The patient verbalized understanding of and has agreed to the management plan. Patient is aware to call the clinic if symptoms persist or worsen. Patient is aware when to return to the clinic for a follow-up visit. Patient educated on when it is appropriate to go to the emergency department.   [name] Gaylyn Keas, FNP

## 2023-10-24 ENCOUNTER — Ambulatory Visit: Payer: Self-pay | Admitting: Nurse Practitioner

## 2023-10-25 LAB — CBC WITH DIFFERENTIAL/PLATELET
Basophils Absolute: 0 10*3/uL (ref 0.0–0.2)
Basos: 1 %
EOS (ABSOLUTE): 0.1 10*3/uL (ref 0.0–0.4)
Eos: 1 %
Hematocrit: 47.5 % (ref 37.5–51.0)
Hemoglobin: 15.5 g/dL (ref 13.0–17.7)
Immature Grans (Abs): 0 10*3/uL (ref 0.0–0.1)
Immature Granulocytes: 0 %
Lymphocytes Absolute: 2.7 10*3/uL (ref 0.7–3.1)
Lymphs: 38 %
MCH: 29.1 pg (ref 26.6–33.0)
MCHC: 32.6 g/dL (ref 31.5–35.7)
MCV: 89 fL (ref 79–97)
Monocytes Absolute: 0.5 10*3/uL (ref 0.1–0.9)
Monocytes: 7 %
Neutrophils Absolute: 3.7 10*3/uL (ref 1.4–7.0)
Neutrophils: 53 %
Platelets: 274 10*3/uL (ref 150–450)
RBC: 5.32 x10E6/uL (ref 4.14–5.80)
RDW: 12.9 % (ref 11.6–15.4)
WBC: 7 10*3/uL (ref 3.4–10.8)

## 2023-10-25 LAB — LIPID PANEL
Chol/HDL Ratio: 3.9 ratio (ref 0.0–5.0)
Cholesterol, Total: 138 mg/dL (ref 100–199)
HDL: 35 mg/dL — ABNORMAL LOW (ref >39)
LDL Chol Calc (NIH): 48 mg/dL (ref 0–99)
Triglycerides: 367 mg/dL — ABNORMAL HIGH (ref 0–149)
VLDL Cholesterol Cal: 55 mg/dL — ABNORMAL HIGH (ref 5–40)

## 2023-10-25 LAB — CMP14+EGFR
ALT: 26 IU/L (ref 0–44)
AST: 25 IU/L (ref 0–40)
Albumin: 4.9 g/dL (ref 3.8–4.9)
Alkaline Phosphatase: 46 IU/L (ref 44–121)
BUN/Creatinine Ratio: 15 (ref 9–20)
BUN: 20 mg/dL (ref 6–24)
Bilirubin Total: 0.3 mg/dL (ref 0.0–1.2)
CO2: 20 mmol/L (ref 20–29)
Calcium: 9.7 mg/dL (ref 8.7–10.2)
Chloride: 100 mmol/L (ref 96–106)
Creatinine, Ser: 1.3 mg/dL — ABNORMAL HIGH (ref 0.76–1.27)
Globulin, Total: 2.3 g/dL (ref 1.5–4.5)
Glucose: 208 mg/dL — ABNORMAL HIGH (ref 70–99)
Potassium: 4.3 mmol/L (ref 3.5–5.2)
Sodium: 138 mmol/L (ref 134–144)
Total Protein: 7.2 g/dL (ref 6.0–8.5)
eGFR: 65 mL/min/{1.73_m2} (ref >59)

## 2023-10-25 LAB — PSA, TOTAL AND FREE
PSA, Free Pct: 27.8 %
PSA, Free: 0.25 ng/mL
Prostate Specific Ag, Serum: 0.9 ng/mL (ref 0.0–4.0)

## 2023-10-25 LAB — VITAMIN B12: Vitamin B-12: 940 pg/mL (ref 232–1245)

## 2023-12-20 ENCOUNTER — Ambulatory Visit: Payer: Self-pay

## 2023-12-20 NOTE — Telephone Encounter (Signed)
 FYI Only or Action Required?:  would like to be worked into provider schedule late afternoon any day prior to schedule appt, if an appointment becomes available.  Patient was last seen in primary care on 10/21/2023 by Gladis Mustard, FNP.  Called Nurse Triage reporting Hypertension.  Symptoms began Saturday.  Interventions attempted: Nothing.  Symptoms are: gradually worsening.  Triage Disposition: See PCP When Office is Open (Within 3 Days)  Patient/caregiver understands and will follow disposition?: Yes   Copied from CRM [id number]. Topic: Clinical - Red Word Triage >> Dec 20, 2023 12:39 PM Carlatta H wrote: [facility] that prompted transfer to Nurse Triage: Posion ivy blister//Red and puss//Exposed on Saturday//   ----------------------------------------------------------------------- From previous Reason for Contact - Scheduling: Patient/patient representative is calling to schedule an appointment. Refer to attachments for appointment information. Reason for Disposition  [1] Severe localized itching AND [2] after 2 days of steroid cream  Answer Assessment - Initial Assessment Questions 1. APPEARANCE of RASH: What does the rash look like? (e.g., blisters, dry flaky skin, red spots, redness, sores)     Red raised blister with fluid and puss 2. LOCATION: Where is the rash located?      Left arm between elbow and wrist 3. NUMBER: How many spots are there?      many 4. SIZE: How big are the spots? (e.g., inches, cm; or compare to size of pinhead, tip of pen, eraser, pea)      various 5. ONSET: When did the rash start?      Saturday 6. ITCHING: Does the rash itch? If Yes, ask: How bad is the itch?  (Scale 0-10; or none, mild, moderate, severe)     Mild  7. PAIN: Does the rash hurt? If Yes, ask: How bad is the pain?  (Scale 0-10; or none, mild, moderate, severe)     no 8. OTHER SYMPTOMS: Do you have any other symptoms? (e.g., fever)     no 9. PREGNANCY:  Is there any chance you are pregnant? When was your last menstrual period?     Na  Pt states this is poison Ivy/Oak and every time he comes in contact with it this happens - then he has to go to the doctor to get a shot to make it go away.  Protocols used: Rash or Redness - Localized-A-AH

## 2023-12-20 NOTE — Telephone Encounter (Signed)
 E2C2 scheduled appointment.

## 2023-12-23 ENCOUNTER — Encounter: Payer: Self-pay | Admitting: Nurse Practitioner

## 2023-12-23 ENCOUNTER — Ambulatory Visit: Admitting: Nurse Practitioner

## 2023-12-23 VITALS — BP 107/70 | HR 85 | Temp 97.1°F | Ht 71.0 in | Wt 212.0 lb

## 2023-12-23 DIAGNOSIS — L247 Irritant contact dermatitis due to plants, except food: Secondary | ICD-10-CM | POA: Diagnosis not present

## 2023-12-23 MED ORDER — METHYLPREDNISOLONE ACETATE 80 MG/ML IJ SUSP
80.0000 mg | Freq: Once | INTRAMUSCULAR | Status: AC
  Administered 2023-12-23: 80 mg via INTRAMUSCULAR

## 2023-12-23 NOTE — Progress Notes (Signed)
   Subjective:    Patient ID: Steven Mcgee, male    DOB: 1971-03-17, 53 y.o.   MRN: [id number]   Chief Complaint: Rash on left arm (Exposed to poison oak last weekend)   HPI  Patient has been doing yard work and was exposed to poison ivy. Now has rash on left forearm. Itches.  Patient Active Problem List   Diagnosis Date Noted   Long-term current use of injectable noninsulin antidiabetic medication 01/20/2023   Hyperlipidemia associated with type 2 diabetes mellitus (HCC) 10/12/2018   BMI 32.0-32.9,adult 03/25/2016   Diabetes mellitus treated with oral medication (HCC) 10/22/2013        Review of Systems  Constitutional:  Negative for diaphoresis.  Eyes:  Negative for pain.  Respiratory:  Negative for shortness of breath.   Cardiovascular:  Negative for chest pain, palpitations and leg swelling.  Gastrointestinal:  Negative for abdominal pain.  Endocrine: Negative for polydipsia.  Skin:  Negative for rash.  Neurological:  Negative for dizziness, weakness and headaches.  Hematological:  Does not bruise/bleed easily.  All other systems reviewed and are negative.      Objective:   Physical Exam Constitutional:      Appearance: Normal appearance.  Cardiovascular:     Rate and Rhythm: Normal rate and regular rhythm.     Heart sounds: Normal heart sounds.  Pulmonary:     Breath sounds: Normal breath sounds.  Skin:    General: Skin is warm.  Neurological:     General: No focal deficit present.     Mental Status: He is alert and oriented to person, place, and time.  Psychiatric:        Mood and Affect: Mood normal.        Behavior: Behavior normal.    BP 107/70   Pulse 85   Temp (!) 97.1 F (36.2 C) (Temporal)   Ht 5' 11 (1.803 m)   Wt 212 lb (96.2 kg)   SpO2 95%   BMI 29.57 kg/m         Assessment & Plan:   Steven Mcgee in today with chief complaint of Rash on left arm (Exposed to poison oak last weekend)   1. Irritant contact dermatitis  due to plants, except food (Primary) Avoid scratching Cool compresses RTO prn - methylPREDNISolone  acetate (DEPO-MEDROL ) injection 80 mg    The above assessment and management plan was discussed with the patient. The patient verbalized understanding of and has agreed to the management plan. Patient is aware to call the clinic if symptoms persist or worsen. Patient is aware when to return to the clinic for a follow-up visit. Patient educated on when it is appropriate to go to the emergency department.   [name] Gladis, FNP

## 2023-12-23 NOTE — Patient Instructions (Signed)
 Poison Ivy Dermatitis Poison ivy dermatitis is redness and soreness of the skin caused by chemicals in the leaves of the poison ivy plant. You may have very bad itching, swelling, a rash, and blisters. What are the causes? Touching a poison ivy plant. Touching something that has the chemical on it. This may include animals or objects that have come in contact with the plant. What increases the risk? Going outdoors often in wooded or [place] areas. Going outdoors without wearing protective clothing, such as closed shoes, long pants, and a long-sleeved shirt. What are the signs or symptoms?  Skin redness. Very bad itching. A rash that often includes bumps and blisters. The rash usually appears 48 hours after exposure, if you have had it before. If this is the first time you have it, the rash may not appear until a week after exposure. Swelling. This may occur if the reaction is very bad. Symptoms usually last for 1-2 weeks. The first time you get this condition, symptoms may last 3-4 weeks. How is this treated? This condition may be treated with: Hydrocortisone cream or calamine lotion to relieve itching. Oatmeal baths to soothe the skin. Medicines, such as over-the-counter antihistamine tablets. Oral steroid medicine for very bad reactions. Follow these instructions at home: Medicines Take or apply over-the-counter and prescription medicines only as told by your doctor. Use hydrocortisone cream or calamine lotion as needed to help with itching. General instructions Do not scratch or rub your skin. Put a cold, wet cloth (cold compress) on the affected areas or take baths in cool water. This will help with itching. Avoid hot baths and showers. Take oatmeal baths as needed. Use colloidal oatmeal. You can get this at a pharmacy or grocery store. Follow the instructions on the package. While you have the rash, wash your clothes right after you wear them. Check the affected area every day  for signs of infection. Check for: More redness, swelling, or pain. Fluid or blood. Warmth. Pus or a bad smell. Keep all follow-up visits. Your doctor may want to see how your skin is doing with treatment. How is this prevented?  Know what poison ivy looks like, so you can avoid it. This plant has three leaves with flowering branches on a single stem. The leaves are glossy. The leaves have uneven edges that come to a point. If you touch poison ivy, wash your skin with soap and water right away. Be sure to wash under your fingernails. When hiking or camping, wear long pants, a long-sleeved shirt, long socks, and hiking boots. You can also use a lotion on your skin that helps to prevent contact with poison ivy. If you think that your clothes or outdoor gear came in contact with poison ivy, rinse them off with a garden hose before you bring them inside your house. When doing yard work or gardening, wear gloves, long sleeves, long pants, and boots. Wash your garden tools and gloves if they come in contact with poison ivy. If you think that your pet has come into contact with poison ivy, wash them with pet shampoo and water. Make sure to wear gloves while washing your pet. Contact a doctor if: You have open sores in the rash area. You have any signs of infection. You have redness that spreads past the rash area. You have a fever. You have a rash over a large area of your body. You have a rash on your eyes, mouth, or genitals. Your rash does not get better after  a few weeks. Get help right away if: Your face swells or your eyes swell shut. You have trouble breathing. You have trouble swallowing. These symptoms may be an emergency. Do not wait to see if the symptoms will go away. Get help right away. Call 911. This information is not intended to replace advice given to you by your health care provider. Make sure you discuss any questions you have with your health care provider. Document  Revised: 10/22/2021 Document Reviewed: 10/22/2021 Elsevier Patient Education  2024 [employer].

## 2024-01-20 ENCOUNTER — Ambulatory Visit (INDEPENDENT_AMBULATORY_CARE_PROVIDER_SITE_OTHER): Admitting: Nurse Practitioner

## 2024-01-20 ENCOUNTER — Encounter: Payer: Self-pay | Admitting: Nurse Practitioner

## 2024-01-20 VITALS — BP 112/75 | HR 51 | Temp 97.7°F | Ht 71.0 in | Wt 211.0 lb

## 2024-01-20 DIAGNOSIS — R809 Proteinuria, unspecified: Secondary | ICD-10-CM

## 2024-01-20 DIAGNOSIS — Z7984 Long term (current) use of oral hypoglycemic drugs: Secondary | ICD-10-CM | POA: Diagnosis not present

## 2024-01-20 DIAGNOSIS — E119 Type 2 diabetes mellitus without complications: Secondary | ICD-10-CM

## 2024-01-20 DIAGNOSIS — E1169 Type 2 diabetes mellitus with other specified complication: Secondary | ICD-10-CM | POA: Diagnosis not present

## 2024-01-20 DIAGNOSIS — Z7985 Long-term (current) use of injectable non-insulin antidiabetic drugs: Secondary | ICD-10-CM

## 2024-01-20 DIAGNOSIS — E785 Hyperlipidemia, unspecified: Secondary | ICD-10-CM

## 2024-01-20 DIAGNOSIS — Z6829 Body mass index (BMI) 29.0-29.9, adult: Secondary | ICD-10-CM

## 2024-01-20 LAB — BAYER DCA HB A1C WAIVED: HB A1C (BAYER DCA - WAIVED): 7.4 % — ABNORMAL HIGH (ref 4.8–5.6)

## 2024-01-20 MED ORDER — TIRZEPATIDE 10 MG/0.5ML ~~LOC~~ SOAJ: 10.0000 mg | SUBCUTANEOUS | 2 refills | Status: DC

## 2024-01-20 MED ORDER — METFORMIN HCL 1000 MG PO TABS: 1000.0000 mg | ORAL_TABLET | Freq: Two times a day (BID) | ORAL | 1 refills | Status: DC

## 2024-01-20 MED ORDER — GLIMEPIRIDE 4 MG PO TABS: 4.0000 mg | ORAL_TABLET | Freq: Two times a day (BID) | ORAL | 1 refills | Status: DC

## 2024-01-20 MED ORDER — LISINOPRIL 10 MG PO TABS: 10.0000 mg | ORAL_TABLET | Freq: Every day | ORAL | 1 refills | Status: DC

## 2024-01-20 MED ORDER — FENOFIBRATE 145 MG PO TABS: 145.0000 mg | ORAL_TABLET | Freq: Every day | ORAL | 1 refills | Status: DC

## 2024-01-20 MED ORDER — ROSUVASTATIN CALCIUM 10 MG PO TABS: 10.0000 mg | ORAL_TABLET | Freq: Every day | ORAL | 1 refills | Status: DC

## 2024-01-20 NOTE — Patient Instructions (Signed)

## 2024-01-20 NOTE — Progress Notes (Signed)
 Subjective:    Patient ID: Imre Vecchione, male    DOB: 08-27-1970, 53 y.o.   MRN: [id number]   Chief Complaint: medical management of chronic issues     HPI:  Euell Schiff is a 53 y.o. who identifies as a male who was assigned male at birth.   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 Hyperlipidemia associated with type 2 diabetes mellitus (HCC) Does not watch diet and does no dedicated exercise. Lab Results  Component Value Date   CHOL 138 10/21/2023   HDL 35 (L) 10/21/2023   LDLCALC 48 10/21/2023   TRIG 367 (H) 10/21/2023   CHOLHDL 3.9 10/21/2023     2. Diabetes mellitus treated with oral medication (HCC) He will not check his blood sugars. Changed from trulicty to [facility] at last visit. Lab Results  Component Value Date   HGBA1C 8.1 (H) 10/21/2023     3. Long-term current use of injectable noninsulin antidiabetic medication Is on trulicity weekly  4. BMI 29.0-29.9,adult Weight is down 6 lbs   Wt Readings from Last 3 Encounters:  01/20/24 211 lb (95.7 kg)  12/23/23 212 lb (96.2 kg)  10/21/23 217 lb (98.4 kg)   BMI Readings from Last 3 Encounters:  01/20/24 29.43 kg/m  12/23/23 29.57 kg/m  10/21/23 30.27 kg/m        New complaints: None today  Allergies  Allergen Reactions   Sulfa Antibiotics    Outpatient Encounter Medications as of 01/20/2024  Medication Sig   dapagliflozin propanediol (FARXIGA) 10 MG TABS tablet Take 1 tablet (10 mg total) by mouth daily.   fenofibrate (TRICOR) 145 MG tablet Take 1 tablet (145 mg total) by mouth daily.   glimepiride (AMARYL) 4 MG tablet Take 1 tablet (4 mg total) by mouth in the morning and at bedtime.   ketoconazole (NIZORAL) 200 MG tablet Take 1 tablet (200 mg total) by mouth daily.   lisinopril (ZESTRIL) 10 MG tablet Take 1 tablet (10 mg total) by mouth daily.   metFORMIN (GLUCOPHAGE)  1000 MG tablet Take 1 tablet (1,000 mg total) by mouth 2 (two) times daily with a meal.   rosuvastatin (CRESTOR) 10 MG tablet Take 1 tablet (10 mg total) by mouth daily.   tirzepatide (MOUNJARO) 7.5 MG/0.5ML Pen Inject 7.5 mg into the skin once a week.   valACYclovir (VALTREX) 1000 MG tablet Take 1 tablet (1,000 mg total) by mouth 3 (three) times daily.   No facility-administered encounter medications on file as of 01/20/2024.    Past Surgical History:  Procedure Laterality Date   APPENDECTOMY      Family History  Problem Relation Age of Onset   Cancer Mother       Controlled substance contract: n/a     Review of Systems  Constitutional:  Negative for diaphoresis.  Eyes:  Negative for pain.  Respiratory:  Negative for shortness of breath.   Cardiovascular:  Negative for chest pain, palpitations and leg swelling.  Gastrointestinal:  Negative for abdominal pain.  Endocrine: Negative for polydipsia.  Skin:  Negative for rash.  Neurological:  Negative for dizziness, weakness and headaches.  Hematological:  Does not bruise/bleed easily.  All other systems reviewed and are negative.      Objective:   Physical Exam Vitals and nursing note reviewed.  Constitutional:      Appearance: Normal appearance. He is well-developed.  HENT:  Head: Normocephalic.     Nose: Nose normal.     Mouth/Throat:     Mouth: Mucous membranes are moist.     Pharynx: Oropharynx is clear.  Eyes:     Pupils: Pupils are equal, round, and reactive to light.  Neck:     Thyroid: No thyroid mass or thyromegaly.     Vascular: No carotid bruit or JVD.     Trachea: Phonation normal.  Cardiovascular:     Rate and Rhythm: Normal rate and regular rhythm.  Pulmonary:     Effort: Pulmonary effort is normal. No respiratory distress.     Breath sounds: Normal breath sounds.  Abdominal:     General: Bowel sounds are normal.     Palpations: Abdomen is soft.     Tenderness: There is no abdominal  tenderness.  Musculoskeletal:        General: Normal range of motion.     Cervical back: Normal range of motion and neck supple.  Lymphadenopathy:     Cervical: No cervical adenopathy.  Skin:    General: Skin is warm and dry.  Neurological:     Mental Status: He is alert and oriented to person, place, and time.  Psychiatric:        Behavior: Behavior normal.        Thought Content: Thought content normal.        Judgment: Judgment normal.     BP 112/75   Pulse (!) 51   Temp 97.7 F (36.5 C) (Temporal)   Ht 5' 11 (1.803 m)   Wt 211 lb (95.7 kg)   SpO2 94%   BMI 29.43 kg/m   HGBa1c 7.4%     Assessment & Plan:  [name] in today with chief complaint of medical management of chronic issues     1. Hyperlipidemia associated with type 2 diabetes mellitus (HCC) (Primary) Low fat diet - Lipid panel  2. Diabetes mellitus treated with oral medication (HCC) Stricter carb counting Moujario increased 10mg  weekly - Bayer DCA Hb A1c Waived - CBC with Differential/Platelet - CMP14+EGFR - Vitamin B12  3. Long-term current use of injectable noninsulin antidiabetic medication  4. BMI 32.0-32.9,adult Discussed diet and exercise for person with BMI >25 Will recheck weight in 3-6 months  Meds ordered this encounter  Medications   fenofibrate (TRICOR) 145 MG tablet    Sig: Take 1 tablet (145 mg total) by mouth daily.    Dispense:  90 tablet    Refill:  1    Supervising Provider:   MARYANNE CHEW A [[id number]]   rosuvastatin (CRESTOR) 10 MG tablet    Sig: Take 1 tablet (10 mg total) by mouth daily.    Dispense:  90 tablet    Refill:  1    Supervising Provider:   MARYANNE CHEW A [[id number]]   lisinopril (ZESTRIL) 10 MG tablet    Sig: Take 1 tablet (10 mg total) by mouth daily.    Dispense:  90 tablet    Refill:  1    Supervising Provider:   DETTINGER, JOSHUA A [[id number]]   glimepiride (AMARYL) 4 MG tablet    Sig: Take 1 tablet (4 mg total) by mouth in the  morning and at bedtime.    Dispense:  180 tablet    Refill:  1    Supervising Provider:   DETTINGER, JOSHUA A [[id number]]   metFORMIN (GLUCOPHAGE) 1000 MG tablet    Sig: Take 1 tablet (1,000 mg total) by mouth 2 (two)  times daily with a meal.    Dispense:  180 tablet    Refill:  1    Supervising Provider:   MARYANNE CHEW A [[id number]]   tirzepatide (MOUNJARO) 10 MG/0.5ML Pen    Sig: Inject 10 mg into the skin once a week.    Dispense:  6 mL    Refill:  2    Supervising Provider:   MARYANNE CHEW A [[id number]]     The above assessment and management plan was discussed with the patient. The patient verbalized understanding of and has agreed to the management plan. Patient is aware to call the clinic if symptoms persist or worsen. Patient is aware when to return to the clinic for a follow-up visit. Patient educated on when it is appropriate to go to the emergency department.   [name] Gladis, FNP

## 2024-01-21 LAB — CBC WITH DIFFERENTIAL/PLATELET
Basophils Absolute: 0 x10E3/uL (ref 0.0–0.2)
Basos: 0 %
EOS (ABSOLUTE): 0.1 x10E3/uL (ref 0.0–0.4)
Eos: 1 %
Hematocrit: 48.5 % (ref 37.5–51.0)
Hemoglobin: 15.8 g/dL (ref 13.0–17.7)
Immature Grans (Abs): 0 x10E3/uL (ref 0.0–0.1)
Immature Granulocytes: 0 %
Lymphocytes Absolute: 2.5 x10E3/uL (ref 0.7–3.1)
Lymphs: 36 %
MCH: 29.6 pg (ref 26.6–33.0)
MCHC: 32.6 g/dL (ref 31.5–35.7)
MCV: 91 fL (ref 79–97)
Monocytes Absolute: 0.5 x10E3/uL (ref 0.1–0.9)
Monocytes: 7 %
Neutrophils Absolute: 3.8 x10E3/uL (ref 1.4–7.0)
Neutrophils: 56 %
Platelets: 279 x10E3/uL (ref 150–450)
RBC: 5.33 x10E6/uL (ref 4.14–5.80)
RDW: 13.4 % (ref 11.6–15.4)
WBC: 6.8 x10E3/uL (ref 3.4–10.8)

## 2024-01-21 LAB — MICROALBUMIN / CREATININE URINE RATIO
Creatinine, Urine: 62.7 mg/dL
Microalb/Creat Ratio: <5 mg/g{creat} (ref 0–29)
Microalbumin, Urine: <3 ug/mL

## 2024-01-21 LAB — CMP14+EGFR
ALT: 23 IU/L (ref 0–44)
AST: 18 IU/L (ref 0–40)
Albumin: 5 g/dL — ABNORMAL HIGH (ref 3.8–4.9)
Alkaline Phosphatase: 37 IU/L — ABNORMAL LOW (ref 44–121)
BUN/Creatinine Ratio: 17 (ref 9–20)
BUN: 22 mg/dL (ref 6–24)
Bilirubin Total: 0.4 mg/dL (ref 0.0–1.2)
CO2: 21 mmol/L (ref 20–29)
Calcium: 9.8 mg/dL (ref 8.7–10.2)
Chloride: 102 mmol/L (ref 96–106)
Creatinine, Ser: 1.33 mg/dL — ABNORMAL HIGH (ref 0.76–1.27)
Globulin, Total: 2.4 g/dL (ref 1.5–4.5)
Glucose: 179 mg/dL — ABNORMAL HIGH (ref 70–99)
Potassium: 4.3 mmol/L (ref 3.5–5.2)
Sodium: 139 mmol/L (ref 134–144)
Total Protein: 7.4 g/dL (ref 6.0–8.5)
eGFR: 63 mL/min/1.73 (ref >59)

## 2024-01-21 LAB — LIPID PANEL
Chol/HDL Ratio: 3.1 ratio (ref 0.0–5.0)
Cholesterol, Total: 124 mg/dL (ref 100–199)
HDL: 40 mg/dL (ref >39)
LDL Chol Calc (NIH): 51 mg/dL (ref 0–99)
Triglycerides: 207 mg/dL — ABNORMAL HIGH (ref 0–149)
VLDL Cholesterol Cal: 33 mg/dL (ref 5–40)

## 2024-01-23 ENCOUNTER — Ambulatory Visit: Payer: Self-pay | Admitting: Nurse Practitioner

## 2024-05-11 ENCOUNTER — Ambulatory Visit: Payer: Self-pay | Admitting: Nurse Practitioner

## 2024-05-11 ENCOUNTER — Encounter: Payer: Self-pay | Admitting: Nurse Practitioner

## 2024-05-11 VITALS — BP 102/69 | HR 82 | Temp 97.7°F | Ht 71.0 in | Wt 206.0 lb

## 2024-05-11 DIAGNOSIS — R809 Proteinuria, unspecified: Secondary | ICD-10-CM | POA: Diagnosis not present

## 2024-05-11 DIAGNOSIS — Z7984 Long term (current) use of oral hypoglycemic drugs: Secondary | ICD-10-CM | POA: Diagnosis not present

## 2024-05-11 DIAGNOSIS — E1169 Type 2 diabetes mellitus with other specified complication: Secondary | ICD-10-CM | POA: Diagnosis not present

## 2024-05-11 DIAGNOSIS — Z7985 Long-term (current) use of injectable non-insulin antidiabetic drugs: Secondary | ICD-10-CM

## 2024-05-11 DIAGNOSIS — E119 Type 2 diabetes mellitus without complications: Secondary | ICD-10-CM

## 2024-05-11 DIAGNOSIS — Z6832 Body mass index (BMI) 32.0-32.9, adult: Secondary | ICD-10-CM

## 2024-05-11 DIAGNOSIS — E785 Hyperlipidemia, unspecified: Secondary | ICD-10-CM | POA: Diagnosis not present

## 2024-05-11 LAB — CBC WITH DIFFERENTIAL/PLATELET

## 2024-05-11 LAB — LIPID PANEL

## 2024-05-11 LAB — BAYER DCA HB A1C WAIVED: HB A1C (BAYER DCA - WAIVED): 6.3 % — ABNORMAL HIGH (ref 4.8–5.6)

## 2024-05-11 MED ORDER — METFORMIN HCL 1000 MG PO TABS: 1000.0000 mg | ORAL_TABLET | Freq: Two times a day (BID) | ORAL | 1 refills | Status: AC

## 2024-05-11 MED ORDER — ROSUVASTATIN CALCIUM 10 MG PO TABS: 10.0000 mg | ORAL_TABLET | Freq: Every day | ORAL | 1 refills | Status: AC

## 2024-05-11 MED ORDER — TIRZEPATIDE 10 MG/0.5ML ~~LOC~~ SOAJ: 10.0000 mg | SUBCUTANEOUS | 2 refills | Status: AC

## 2024-05-11 MED ORDER — FENOFIBRATE 145 MG PO TABS: 145.0000 mg | ORAL_TABLET | Freq: Every day | ORAL | 1 refills | Status: AC

## 2024-05-11 MED ORDER — GLIMEPIRIDE 4 MG PO TABS: 4.0000 mg | ORAL_TABLET | Freq: Two times a day (BID) | ORAL | 1 refills | Status: AC

## 2024-05-11 MED ORDER — DAPAGLIFLOZIN PROPANEDIOL 10 MG PO TABS: 10.0000 mg | ORAL_TABLET | Freq: Every day | ORAL | 1 refills | Status: AC

## 2024-05-11 MED ORDER — LISINOPRIL 10 MG PO TABS: 10.0000 mg | ORAL_TABLET | Freq: Every day | ORAL | 1 refills | Status: AC

## 2024-05-11 NOTE — Progress Notes (Signed)
 Subjective:    Patient ID: Steven Mcgee, male    DOB: 11/04/1970, 53 y.o.   MRN: [id number]   Chief Complaint: medical management of chronic issues     HPI:  Steven Mcgee is a 53 y.o. who identifies as a male who was assigned male at birth.   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 Hyperlipidemia associated with type 2 diabetes mellitus (HCC) Does not watch diet and does no dedicated exercise. Lab Results  Component Value Date   CHOL 124 01/20/2024   HDL 40 01/20/2024   LDLCALC 51 01/20/2024   TRIG 207 (H) 01/20/2024   CHOLHDL 3.1 01/20/2024     2. Diabetes mellitus treated with oral medication (HCC) He will not check his blood sugars. Changed from trulicty to [facility] at last visit. Lab Results  Component Value Date   HGBA1C 7.4 (H) 01/20/2024     3. Long-term current use of injectable noninsulin antidiabetic medication Is on trulicity  weekly  4. BMI 29.0-29.9,adult Weight is down 6 lbs  Wt Readings from Last 3 Encounters:  05/11/24 206 lb (93.4 kg)  01/20/24 211 lb (95.7 kg)  12/23/23 212 lb (96.2 kg)   BMI Readings from Last 3 Encounters:  05/11/24 28.73 kg/m  01/20/24 29.43 kg/m  12/23/23 29.57 kg/m           New complaints: None today  Allergies  Allergen Reactions   Sulfa Antibiotics    Outpatient Encounter Medications as of 05/11/2024  Medication Sig   dapagliflozin  propanediol (FARXIGA ) 10 MG TABS tablet Take 1 tablet (10 mg total) by mouth daily.   fenofibrate  (TRICOR ) 145 MG tablet Take 1 tablet (145 mg total) by mouth daily.   glimepiride  (AMARYL ) 4 MG tablet Take 1 tablet (4 mg total) by mouth in the morning and at bedtime.   ketoconazole  (NIZORAL ) 200 MG tablet Take 1 tablet (200 mg total) by mouth daily.   lisinopril  (ZESTRIL ) 10 MG tablet Take 1 tablet (10 mg total) by mouth daily.   metFORMIN  (GLUCOPHAGE )  1000 MG tablet Take 1 tablet (1,000 mg total) by mouth 2 (two) times daily with a meal.   rosuvastatin  (CRESTOR ) 10 MG tablet Take 1 tablet (10 mg total) by mouth daily.   tirzepatide  (MOUNJARO ) 10 MG/0.5ML Pen Inject 10 mg into the skin once a week.   valACYclovir  (VALTREX ) 1000 MG tablet Take 1 tablet (1,000 mg total) by mouth 3 (three) times daily.   No facility-administered encounter medications on file as of 05/11/2024.    Past Surgical History:  Procedure Laterality Date   APPENDECTOMY      Family History  Problem Relation Age of Onset   Cancer Mother       Controlled substance contract: n/a     Review of Systems  Constitutional:  Negative for diaphoresis.  Eyes:  Negative for pain.  Respiratory:  Negative for shortness of breath.   Cardiovascular:  Negative for chest pain, palpitations and leg swelling.  Gastrointestinal:  Negative for abdominal pain.  Endocrine: Negative for polydipsia.  Skin:  Negative for rash.  Neurological:  Negative for dizziness, weakness and headaches.  Hematological:  Does not bruise/bleed easily.  All other systems reviewed and are negative.      Objective:   Physical Exam Vitals and nursing note reviewed.  Constitutional:      Appearance: Normal appearance. He is well-developed.  HENT:  Head: Normocephalic.     Nose: Nose normal.     Mouth/Throat:     Mouth: Mucous membranes are moist.     Pharynx: Oropharynx is clear.  Eyes:     Pupils: Pupils are equal, round, and reactive to light.  Neck:     Thyroid: No thyroid mass or thyromegaly.     Vascular: No carotid bruit or JVD.     Trachea: Phonation normal.  Cardiovascular:     Rate and Rhythm: Normal rate and regular rhythm.  Pulmonary:     Effort: Pulmonary effort is normal. No respiratory distress.     Breath sounds: Normal breath sounds.  Abdominal:     General: Bowel sounds are normal.     Palpations: Abdomen is soft.     Tenderness: There is no abdominal  tenderness.  Musculoskeletal:        General: Normal range of motion.     Cervical back: Normal range of motion and neck supple.  Lymphadenopathy:     Cervical: No cervical adenopathy.  Skin:    General: Skin is warm and dry.  Neurological:     Mental Status: He is alert and oriented to person, place, and time.  Psychiatric:        Behavior: Behavior normal.        Thought Content: Thought content normal.        Judgment: Judgment normal.     BP 102/69   Pulse 82   Temp 97.7 F (36.5 C) (Temporal)   Ht 5' 11 (1.803 m)   Wt 206 lb (93.4 kg)   SpO2 95%   BMI 28.73 kg/m    HGBa1c 6.4%     Assessment & Plan:  [name] in today with chief complaint of medical management of chronic issues     1. Hyperlipidemia associated with type 2 diabetes mellitus (HCC) (Primary) Low fat diet - Lipid panel  2. Diabetes mellitus treated with oral medication (HCC) Stricter carb counting Going to try cutting metformin  in half and take BID - Bayer DCA Hb A1c Waived - CBC with Differential/Platelet - CMP14+EGFR - Vitamin B12  3. Long-term current use of injectable noninsulin antidiabetic medication  4. BMI 32.0-32.9,adult Discussed diet and exercise for person with BMI >25 Will recheck weight in 3-6 months  Meds ordered this encounter  Medications   tirzepatide  (MOUNJARO ) 10 MG/0.5ML Pen    Sig: Inject 10 mg into the skin once a week.    Dispense:  6 mL    Refill:  2    Supervising Provider:   DETTINGER, JOSHUA A [[id number]]   fenofibrate  (TRICOR ) 145 MG tablet    Sig: Take 1 tablet (145 mg total) by mouth daily.    Dispense:  90 tablet    Refill:  1    Supervising Provider:   DETTINGER, JOSHUA A [[id number]]   rosuvastatin  (CRESTOR ) 10 MG tablet    Sig: Take 1 tablet (10 mg total) by mouth daily.    Dispense:  90 tablet    Refill:  1    Supervising Provider:   DETTINGER, JOSHUA A [[id number]]   lisinopril  (ZESTRIL ) 10 MG tablet    Sig: Take 1 tablet (10 mg total) by  mouth daily.    Dispense:  90 tablet    Refill:  1    Supervising Provider:   DETTINGER, JOSHUA A [[id number]]   dapagliflozin  propanediol (FARXIGA ) 10 MG TABS tablet    Sig: Take 1 tablet (10 mg total) by  mouth daily.    Dispense:  90 tablet    Refill:  1    Supervising Provider:   DETTINGER, JOSHUA A [[id number]]   glimepiride  (AMARYL ) 4 MG tablet    Sig: Take 1 tablet (4 mg total) by mouth in the morning and at bedtime.    Dispense:  180 tablet    Refill:  1    Supervising Provider:   DETTINGER, JOSHUA A [[id number]]   metFORMIN  (GLUCOPHAGE ) 1000 MG tablet    Sig: Take 1 tablet (1,000 mg total) by mouth 2 (two) times daily with a meal.    Dispense:  180 tablet    Refill:  1    Supervising Provider:   MARYANNE CHEW A [[id number]]      The above assessment and management plan was discussed with the patient. The patient verbalized understanding of and has agreed to the management plan. Patient is aware to call the clinic if symptoms persist or worsen. Patient is aware when to return to the clinic for a follow-up visit. Patient educated on when it is appropriate to go to the emergency department.   [name] Gladis, FNP

## 2024-05-12 LAB — CMP14+EGFR
ALT: 27 IU/L (ref 0–44)
AST: 24 IU/L (ref 0–40)
Albumin: 4.6 g/dL (ref 3.8–4.9)
Alkaline Phosphatase: 33 IU/L — AB (ref 47–123)
BUN/Creatinine Ratio: 18 (ref 9–20)
BUN: 21 mg/dL (ref 6–24)
Bilirubin Total: 0.4 mg/dL (ref 0.0–1.2)
CO2: 22 mmol/L (ref 20–29)
Calcium: 9.7 mg/dL (ref 8.7–10.2)
Chloride: 101 mmol/L (ref 96–106)
Creatinine, Ser: 1.14 mg/dL (ref 0.76–1.27)
Globulin, Total: 2 g/dL (ref 1.5–4.5)
Glucose: 194 mg/dL — AB (ref 70–99)
Potassium: 4.2 mmol/L (ref 3.5–5.2)
Sodium: 138 mmol/L (ref 134–144)
Total Protein: 6.6 g/dL (ref 6.0–8.5)
eGFR: 76 mL/min/1.73 (ref >59)

## 2024-05-12 LAB — CBC WITH DIFFERENTIAL/PLATELET
Basos: 1 %
EOS (ABSOLUTE): 0 x10E3/uL (ref 0.0–0.2)
Eos: 2 %
Hematocrit: 46.3 % (ref 37.5–51.0)
Hemoglobin: 15.4 g/dL (ref 13.0–17.7)
Immature Granulocytes: 0 %
Immature Granulocytes: 0 x10E3/uL (ref 0.0–0.1)
Lymphs: 40 %
MCH: 30.3 pg (ref 26.6–33.0)
MCHC: 33.3 g/dL (ref 31.5–35.7)
MCV: 91 fL (ref 79–97)
Monocytes Absolute: 0.1 x10E3/uL (ref 0.0–0.4)
Monocytes Absolute: 0.5 x10E3/uL (ref 0.1–0.9)
Monocytes: 8 %
Neutrophils Absolute: 2.3 x10E3/uL (ref 0.7–3.1)
Neutrophils Absolute: 2.9 x10E3/uL (ref 1.4–7.0)
Neutrophils: 49 %
Platelets: 274 x10E3/uL (ref 150–450)
RBC: 5.09 x10E6/uL (ref 4.14–5.80)
RDW: 12.7 % (ref 11.6–15.4)
WBC: 5.8 x10E3/uL (ref 3.4–10.8)

## 2024-05-12 LAB — LIPID PANEL
Cholesterol, Total: 104 mg/dL (ref 100–199)
HDL: 40 mg/dL (ref >39)
LDL CALC COMMENT:: 2.6 ratio (ref 0.0–5.0)
LDL Chol Calc (NIH): 41 mg/dL (ref 0–99)
Triglycerides: 127 mg/dL (ref 0–149)
VLDL Cholesterol Cal: 23 mg/dL (ref 5–40)

## 2024-05-14 ENCOUNTER — Ambulatory Visit: Payer: Self-pay | Admitting: Nurse Practitioner

## 2024-08-03 ENCOUNTER — Ambulatory Visit: Admitting: Nurse Practitioner
